# Patient Record
Sex: Female | Born: 1950 | Race: Black or African American | Hispanic: No | Marital: Single | Smoking: Never smoker
Health system: Southern US, Community
[De-identification: ages and names within clinical notes are randomized; demographics above are authoritative.]

## PROBLEM LIST (undated history)

## (undated) DIAGNOSIS — D649 Anemia, unspecified: Secondary | ICD-10-CM

## (undated) DIAGNOSIS — M1990 Unspecified osteoarthritis, unspecified site: Secondary | ICD-10-CM

## (undated) DIAGNOSIS — K219 Gastro-esophageal reflux disease without esophagitis: Secondary | ICD-10-CM

## (undated) DIAGNOSIS — I10 Essential (primary) hypertension: Secondary | ICD-10-CM

## (undated) DIAGNOSIS — R238 Other skin changes: Secondary | ICD-10-CM

## (undated) DIAGNOSIS — I499 Cardiac arrhythmia, unspecified: Secondary | ICD-10-CM

## (undated) DIAGNOSIS — E785 Hyperlipidemia, unspecified: Secondary | ICD-10-CM

## (undated) DIAGNOSIS — R233 Spontaneous ecchymoses: Secondary | ICD-10-CM

## (undated) DIAGNOSIS — F419 Anxiety disorder, unspecified: Secondary | ICD-10-CM

## (undated) HISTORY — PX: COLONOSCOPY: SHX174

## (undated) HISTORY — PX: EYE SURGERY: SHX253

## (undated) HISTORY — PX: WISDOM TOOTH EXTRACTION: SHX21

---

## 2005-08-21 ENCOUNTER — Ambulatory Visit: Payer: Self-pay | Admitting: General Practice

## 2006-03-20 ENCOUNTER — Other Ambulatory Visit: Payer: Self-pay

## 2006-03-20 ENCOUNTER — Emergency Department: Payer: Self-pay | Admitting: Emergency Medicine

## 2006-03-21 ENCOUNTER — Emergency Department: Payer: Self-pay | Admitting: Emergency Medicine

## 2006-07-31 ENCOUNTER — Other Ambulatory Visit: Payer: Self-pay

## 2006-08-14 ENCOUNTER — Ambulatory Visit: Payer: Self-pay | Admitting: Unknown Physician Specialty

## 2006-08-27 ENCOUNTER — Emergency Department: Payer: Self-pay | Admitting: Emergency Medicine

## 2006-09-16 ENCOUNTER — Ambulatory Visit: Payer: Self-pay | Admitting: General Practice

## 2006-11-23 ENCOUNTER — Emergency Department: Payer: Self-pay | Admitting: Internal Medicine

## 2007-04-27 ENCOUNTER — Ambulatory Visit: Payer: Self-pay | Admitting: Gastroenterology

## 2007-09-29 ENCOUNTER — Ambulatory Visit: Payer: Self-pay | Admitting: Internal Medicine

## 2008-09-02 ENCOUNTER — Ambulatory Visit: Payer: Self-pay | Admitting: Internal Medicine

## 2008-09-29 ENCOUNTER — Ambulatory Visit: Payer: Self-pay | Admitting: Internal Medicine

## 2008-10-01 ENCOUNTER — Emergency Department: Payer: Self-pay | Admitting: Internal Medicine

## 2009-10-23 ENCOUNTER — Emergency Department: Payer: Self-pay | Admitting: Internal Medicine

## 2009-11-15 ENCOUNTER — Ambulatory Visit: Payer: Self-pay | Admitting: Internal Medicine

## 2011-02-15 ENCOUNTER — Ambulatory Visit: Payer: Self-pay | Admitting: Internal Medicine

## 2011-02-15 ENCOUNTER — Emergency Department: Payer: Self-pay | Admitting: Emergency Medicine

## 2012-04-07 ENCOUNTER — Ambulatory Visit: Payer: Self-pay | Admitting: Internal Medicine

## 2013-05-28 ENCOUNTER — Ambulatory Visit: Payer: Self-pay | Admitting: Internal Medicine

## 2014-02-03 ENCOUNTER — Emergency Department: Payer: Self-pay | Admitting: Emergency Medicine

## 2014-02-03 LAB — URINALYSIS, COMPLETE
BACTERIA: NONE SEEN
Bilirubin,UR: NEGATIVE
Glucose,UR: NEGATIVE mg/dL (ref 0–75)
Ketone: NEGATIVE
Leukocyte Esterase: NEGATIVE
NITRITE: NEGATIVE
PH: 6 (ref 4.5–8.0)
Protein: NEGATIVE
RBC,UR: 1 /HPF (ref 0–5)
Specific Gravity: 1.012 (ref 1.003–1.030)
WBC UR: NONE SEEN /HPF (ref 0–5)

## 2014-07-07 ENCOUNTER — Ambulatory Visit: Payer: Self-pay | Admitting: Internal Medicine

## 2014-11-02 ENCOUNTER — Emergency Department: Payer: Self-pay | Admitting: Emergency Medicine

## 2014-11-02 LAB — CBC
HCT: 39.7 % (ref 35.0–47.0)
HGB: 12.9 g/dL (ref 12.0–16.0)
MCH: 30.5 pg (ref 26.0–34.0)
MCHC: 32.5 g/dL (ref 32.0–36.0)
MCV: 94 fL (ref 80–100)
Platelet: 252 10*3/uL (ref 150–440)
RBC: 4.23 10*6/uL (ref 3.80–5.20)
RDW: 13.1 % (ref 11.5–14.5)
WBC: 9 10*3/uL (ref 3.6–11.0)

## 2014-11-02 LAB — BASIC METABOLIC PANEL
ANION GAP: 8 (ref 7–16)
BUN: 20 mg/dL — ABNORMAL HIGH (ref 7–18)
CALCIUM: 9.5 mg/dL (ref 8.5–10.1)
CHLORIDE: 107 mmol/L (ref 98–107)
CO2: 28 mmol/L (ref 21–32)
Creatinine: 0.68 mg/dL (ref 0.60–1.30)
EGFR (African American): >60
EGFR (Non-African Amer.): >60
Glucose: 88 mg/dL (ref 65–99)
OSMOLALITY: 287 (ref 275–301)
Potassium: 3.5 mmol/L (ref 3.5–5.1)
Sodium: 143 mmol/L (ref 136–145)

## 2015-03-01 ENCOUNTER — Other Ambulatory Visit: Payer: Self-pay | Admitting: Physician Assistant

## 2015-03-01 DIAGNOSIS — S83207A Unspecified tear of unspecified meniscus, current injury, left knee, initial encounter: Secondary | ICD-10-CM

## 2015-03-09 ENCOUNTER — Ambulatory Visit
Admission: RE | Admit: 2015-03-09 | Discharge: 2015-03-09 | Disposition: A | Discharge status: Home / Self Care | Payer: BLUE CROSS/BLUE SHIELD | Source: Ambulatory Visit | Attending: Physician Assistant | Admitting: Physician Assistant

## 2015-03-09 DIAGNOSIS — M2242 Chondromalacia patellae, left knee: Secondary | ICD-10-CM | POA: Diagnosis not present

## 2015-03-09 DIAGNOSIS — S83262A Peripheral tear of lateral meniscus, current injury, left knee, initial encounter: Secondary | ICD-10-CM | POA: Insufficient documentation

## 2015-03-09 DIAGNOSIS — S83207A Unspecified tear of unspecified meniscus, current injury, left knee, initial encounter: Secondary | ICD-10-CM

## 2015-03-09 DIAGNOSIS — M25462 Effusion, left knee: Secondary | ICD-10-CM | POA: Insufficient documentation

## 2015-03-09 DIAGNOSIS — M94262 Chondromalacia, left knee: Secondary | ICD-10-CM | POA: Diagnosis not present

## 2015-03-09 DIAGNOSIS — R937 Abnormal findings on diagnostic imaging of other parts of musculoskeletal system: Secondary | ICD-10-CM | POA: Diagnosis not present

## 2015-03-09 DIAGNOSIS — S83012A Lateral subluxation of left patella, initial encounter: Secondary | ICD-10-CM | POA: Diagnosis not present

## 2015-05-08 DIAGNOSIS — M1712 Unilateral primary osteoarthritis, left knee: Secondary | ICD-10-CM | POA: Insufficient documentation

## 2015-09-13 ENCOUNTER — Other Ambulatory Visit: Payer: BLUE CROSS/BLUE SHIELD

## 2015-09-13 ENCOUNTER — Encounter: Payer: Self-pay | Admitting: *Deleted

## 2015-09-13 DIAGNOSIS — I10 Essential (primary) hypertension: Secondary | ICD-10-CM | POA: Diagnosis not present

## 2015-09-13 NOTE — Patient Instructions (Addendum)
  Your procedure is scheduled on: 09-26-15 (TUESDAY) Report to [facility] SAME [facility] 2ND FLOOR To find out your arrival time please call [phone] between 1PM - 3PM on 09-25-15 [facility](MONDAY)  Remember: Instructions that are not followed completely may result in serious medical risk, up to and including death, or upon the discretion of your surgeon and anesthesiologist your surgery may need to be rescheduled.    _X___ 1. Do not eat food or drink liquids after midnight. No gum chewing or hard candies.     _X___ 2. No Alcohol for 24 hours before or after surgery.   ____ 3. Bring all medications with you on the day of surgery if instructed.    _X___ 4. Notify your doctor if there is any change in your medical condition     (cold, fever, infections).     Do not wear jewelry, make-up, hairpins, clips or nail polish.  Do not wear lotions, powders, or perfumes. You may wear deodorant.  Do not shave 48 hours prior to surgery. Men may shave face and neck.  Do not bring valuables to the hospital.    [facility] not responsible for any belongings or valuables.               Contacts, dentures or bridgework may not be worn into surgery.  Leave your suitcase in the car. After surgery it may be brought to your room.  For patients admitted to the hospital, discharge time is determined by your  treatment team.   Patients discharged the day of surgery will not be allowed to drive home.   Please read over the following fact sheets that you were given:      _X___ Take these medicines the morning of surgery with A SIP OF WATER:    1. ZETIA  2. NEXIUM   3. TAKE A NEXIUM ON Monday NIGHT  4. MAY TAKE XANAX IF NEEDED   5.  6.  ____ Fleet Enema (as directed)   _X___ Use CHG Soap as directed  ____ Use inhalers on the day of surgery  ____ Stop metformin 2 days prior to surgery    ____ Take 1/2 of usual insulin dose the night before surgery and none on the morning of surgery.   ____ Stop  Coumadin/Plavix/aspirin-N/A  _X___ Stop Anti-inflammatories-STOP NAPROXEN AND IBUPROFEN NOW-NO NSAIDS OR ASA PRODUCTS-TYLENOL OK   _X___ Stop supplements until after surgery-STOP KRILL OIL NOW   ____ Bring C-Pap to the hospital.

## 2015-09-18 ENCOUNTER — Encounter
Admission: RE | Admit: 2015-09-18 | Discharge: 2015-09-18 | Disposition: A | Discharge status: Home / Self Care | Payer: BLUE CROSS/BLUE SHIELD | Source: Ambulatory Visit | Attending: Anesthesiology | Admitting: Anesthesiology

## 2015-09-18 DIAGNOSIS — Z01812 Encounter for preprocedural laboratory examination: Secondary | ICD-10-CM | POA: Insufficient documentation

## 2015-09-18 DIAGNOSIS — Z01818 Encounter for other preprocedural examination: Secondary | ICD-10-CM | POA: Insufficient documentation

## 2015-09-18 LAB — POTASSIUM: POTASSIUM: 4.2 mmol/L (ref 3.5–5.1)

## 2015-09-26 ENCOUNTER — Encounter
Admission: RE | Disposition: A | Discharge status: Home / Self Care | Payer: Self-pay | Source: Ambulatory Visit | Attending: Surgery

## 2015-09-26 ENCOUNTER — Ambulatory Visit: Payer: BLUE CROSS/BLUE SHIELD | Admitting: Anesthesiology

## 2015-09-26 ENCOUNTER — Ambulatory Visit
Admission: RE | Admit: 2015-09-26 | Discharge: 2015-09-26 | Disposition: A | Discharge status: Home / Self Care | Payer: BLUE CROSS/BLUE SHIELD | Source: Ambulatory Visit | Attending: Surgery | Admitting: Surgery

## 2015-09-26 DIAGNOSIS — Z825 Family history of asthma and other chronic lower respiratory diseases: Secondary | ICD-10-CM | POA: Diagnosis not present

## 2015-09-26 DIAGNOSIS — E785 Hyperlipidemia, unspecified: Secondary | ICD-10-CM | POA: Insufficient documentation

## 2015-09-26 DIAGNOSIS — Z91040 Latex allergy status: Secondary | ICD-10-CM | POA: Insufficient documentation

## 2015-09-26 DIAGNOSIS — Z79899 Other long term (current) drug therapy: Secondary | ICD-10-CM | POA: Insufficient documentation

## 2015-09-26 DIAGNOSIS — M179 Osteoarthritis of knee, unspecified: Secondary | ICD-10-CM | POA: Diagnosis present

## 2015-09-26 DIAGNOSIS — Z823 Family history of stroke: Secondary | ICD-10-CM | POA: Insufficient documentation

## 2015-09-26 DIAGNOSIS — K219 Gastro-esophageal reflux disease without esophagitis: Secondary | ICD-10-CM | POA: Diagnosis not present

## 2015-09-26 DIAGNOSIS — Z8249 Family history of ischemic heart disease and other diseases of the circulatory system: Secondary | ICD-10-CM | POA: Insufficient documentation

## 2015-09-26 DIAGNOSIS — M23242 Derangement of anterior horn of lateral meniscus due to old tear or injury, left knee: Secondary | ICD-10-CM | POA: Insufficient documentation

## 2015-09-26 DIAGNOSIS — M2242 Chondromalacia patellae, left knee: Secondary | ICD-10-CM | POA: Diagnosis not present

## 2015-09-26 DIAGNOSIS — Z880 Allergy status to penicillin: Secondary | ICD-10-CM | POA: Insufficient documentation

## 2015-09-26 DIAGNOSIS — I10 Essential (primary) hypertension: Secondary | ICD-10-CM | POA: Insufficient documentation

## 2015-09-26 DIAGNOSIS — Z882 Allergy status to sulfonamides status: Secondary | ICD-10-CM | POA: Diagnosis not present

## 2015-09-26 DIAGNOSIS — R002 Palpitations: Secondary | ICD-10-CM | POA: Diagnosis not present

## 2015-09-26 HISTORY — DX: Hyperlipidemia, unspecified: E78.5

## 2015-09-26 HISTORY — DX: Gastro-esophageal reflux disease without esophagitis: K21.9

## 2015-09-26 HISTORY — DX: Cardiac arrhythmia, unspecified: I49.9

## 2015-09-26 HISTORY — DX: Essential (primary) hypertension: I10

## 2015-09-26 HISTORY — DX: Spontaneous ecchymoses: R23.3

## 2015-09-26 HISTORY — DX: Other skin changes: R23.8

## 2015-09-26 HISTORY — PX: KNEE ARTHROSCOPY WITH MENISCAL REPAIR: SHX5653

## 2015-09-26 HISTORY — DX: Unspecified osteoarthritis, unspecified site: M19.90

## 2015-09-26 HISTORY — DX: Anxiety disorder, unspecified: F41.9

## 2015-09-26 HISTORY — DX: Anemia, unspecified: D64.9

## 2015-09-26 SURGERY — ARTHROSCOPY, KNEE, WITH MENISCUS REPAIR
Anesthesia: General | Site: Knee | Laterality: Left | Wound class: Clean

## 2015-09-26 MED ORDER — METOCLOPRAMIDE HCL 5 MG/ML IJ SOLN: 5.0000 mg | Freq: Three times a day (TID) | INTRAMUSCULAR | Status: DC | PRN

## 2015-09-26 MED ORDER — POTASSIUM CHLORIDE IN NACL 20-0.9 MEQ/L-% IV SOLN: INTRAVENOUS | Status: DC

## 2015-09-26 MED ORDER — ONDANSETRON HCL 4 MG/2ML IJ SOLN
INTRAMUSCULAR | Status: DC | PRN
  Administered 2015-09-26: 4 mg via INTRAVENOUS

## 2015-09-26 MED ORDER — CLINDAMYCIN PHOSPHATE 900 MG/50ML IV SOLN: 900.0000 mg | Freq: Once | INTRAVENOUS | Status: DC

## 2015-09-26 MED ORDER — ONDANSETRON HCL 4 MG/2ML IJ SOLN: 4.0000 mg | Freq: Four times a day (QID) | INTRAMUSCULAR | Status: DC | PRN

## 2015-09-26 MED ORDER — ONDANSETRON HCL 4 MG PO TABS: 4.0000 mg | ORAL_TABLET | Freq: Four times a day (QID) | ORAL | Status: DC | PRN

## 2015-09-26 MED ORDER — LIDOCAINE HCL (PF) 1 % IJ SOLN
INTRAMUSCULAR | Status: AC
  Filled 2015-09-26: qty 30

## 2015-09-26 MED ORDER — METOCLOPRAMIDE HCL 10 MG PO TABS: 5.0000 mg | ORAL_TABLET | Freq: Three times a day (TID) | ORAL | Status: DC | PRN

## 2015-09-26 MED ORDER — FENTANYL CITRATE (PF) 100 MCG/2ML IJ SOLN
INTRAMUSCULAR | Status: DC | PRN
  Administered 2015-09-26 (×2): 50 ug via INTRAVENOUS

## 2015-09-26 MED ORDER — BUPIVACAINE-EPINEPHRINE (PF) 0.5% -1:200000 IJ SOLN
INTRAMUSCULAR | Status: AC
  Filled 2015-09-26: qty 30

## 2015-09-26 MED ORDER — LIDOCAINE HCL (CARDIAC) 20 MG/ML IV SOLN
INTRAVENOUS | Status: DC | PRN
  Administered 2015-09-26: 100 mg via INTRAVENOUS

## 2015-09-26 MED ORDER — HYDROCODONE-ACETAMINOPHEN 5-325 MG PO TABS: 1.0000 | ORAL_TABLET | Freq: Four times a day (QID) | ORAL | Status: DC | PRN

## 2015-09-26 MED ORDER — EPHEDRINE SULFATE 50 MG/ML IJ SOLN
INTRAMUSCULAR | Status: DC | PRN
  Administered 2015-09-26: 15 mg via INTRAVENOUS

## 2015-09-26 MED ORDER — LIDOCAINE HCL 1 % IJ SOLN
INTRAMUSCULAR | Status: DC | PRN
  Administered 2015-09-26: 30 mL

## 2015-09-26 MED ORDER — BUPIVACAINE-EPINEPHRINE (PF) 0.5% -1:200000 IJ SOLN
INTRAMUSCULAR | Status: DC | PRN
  Administered 2015-09-26: 10 mL via PERINEURAL
  Administered 2015-09-26: 30 mL via PERINEURAL

## 2015-09-26 MED ORDER — FENTANYL CITRATE (PF) 100 MCG/2ML IJ SOLN: 25.0000 ug | INTRAMUSCULAR | Status: DC | PRN

## 2015-09-26 MED ORDER — LACTATED RINGERS IV SOLN
INTRAVENOUS | Status: DC
  Administered 2015-09-26: 10:00:00 via INTRAVENOUS

## 2015-09-26 MED ORDER — MIDAZOLAM HCL 2 MG/2ML IJ SOLN
INTRAMUSCULAR | Status: DC | PRN
  Administered 2015-09-26: 1 mg via INTRAVENOUS

## 2015-09-26 MED ORDER — PROMETHAZINE HCL 25 MG/ML IJ SOLN: 6.2500 mg | INTRAMUSCULAR | Status: DC | PRN

## 2015-09-26 MED ORDER — CLINDAMYCIN PHOSPHATE 900 MG/50ML IV SOLN
INTRAVENOUS | Status: AC
Start: 2015-09-26 — End: 2015-09-26
  Administered 2015-09-26: 900 mg via INTRAVENOUS
  Filled 2015-09-26: qty 50

## 2015-09-26 MED ORDER — PROPOFOL 10 MG/ML IV BOLUS
INTRAVENOUS | Status: DC | PRN
  Administered 2015-09-26: 120 mg via INTRAVENOUS

## 2015-09-26 MED ORDER — HYDROCODONE-ACETAMINOPHEN 5-325 MG PO TABS: 1.0000 | ORAL_TABLET | ORAL | Status: DC | PRN

## 2015-09-26 SURGICAL SUPPLY — 29 items
BAG COUNTER SPONGE EZ (MISCELLANEOUS) IMPLANT
BANDAGE ACE 6X5 VEL STRL LF (GAUZE/BANDAGES/DRESSINGS) ×3 IMPLANT
BLADE FULL RADIUS 3.5 (BLADE) ×3 IMPLANT
BLADE SHAVER 4.5X7 STR FR (MISCELLANEOUS) IMPLANT
CHLORAPREP W/TINT 26ML (MISCELLANEOUS) ×3 IMPLANT
COUNTER SPONGE BAG EZ (MISCELLANEOUS)
GAUZE SPONGE 4X4 12PLY STRL (GAUZE/BANDAGES/DRESSINGS) ×3 IMPLANT
GLOVE BIO SURGEON STRL SZ8 (GLOVE) ×6 IMPLANT
GLOVE BIOGEL M 7.0 STRL (GLOVE) ×6 IMPLANT
GLOVE BIOGEL PI IND STRL 7.5 (GLOVE) ×1 IMPLANT
GLOVE BIOGEL PI INDICATOR 7.5 (GLOVE) ×2
GLOVE INDICATOR 8.0 STRL GRN (GLOVE) ×3 IMPLANT
GOWN STRL REUS W/ TWL LRG LVL3 (GOWN DISPOSABLE) ×1 IMPLANT
GOWN STRL REUS W/ TWL XL LVL3 (GOWN DISPOSABLE) ×2 IMPLANT
GOWN STRL REUS W/TWL LRG LVL3 (GOWN DISPOSABLE) ×2
GOWN STRL REUS W/TWL XL LVL3 (GOWN DISPOSABLE) ×4
IV LACTATED RINGER IRRG 3000ML (IV SOLUTION) ×2
IV LR IRRIG 3000ML ARTHROMATIC (IV SOLUTION) ×1 IMPLANT
KIT RM TURNOVER STRD PROC AR (KITS) ×3 IMPLANT
MANIFOLD NEPTUNE II (INSTRUMENTS) ×3 IMPLANT
NEEDLE HYPO 21X1.5 SAFETY (NEEDLE) ×3 IMPLANT
PACK ARTHROSCOPY KNEE (MISCELLANEOUS) ×3 IMPLANT
PAD GROUND ADULT SPLIT (MISCELLANEOUS) ×3 IMPLANT
PENCIL ELECTRO HAND CTR (MISCELLANEOUS) ×3 IMPLANT
SUT PROLENE 4 0 PS 2 18 (SUTURE) ×3 IMPLANT
SUT TI-CRON 2-0 W/10 SWGD (SUTURE) IMPLANT
SYR 50ML LL SCALE MARK (SYRINGE) ×3 IMPLANT
TUBING ARTHRO INFLOW-ONLY STRL (TUBING) ×3 IMPLANT
WAND HAND CNTRL MULTIVAC 90 (MISCELLANEOUS) IMPLANT

## 2015-09-26 NOTE — Anesthesia Procedure Notes (Signed)
Procedure Name: LMA Insertion Date/Time: 09/26/2015 9:59 AM Performed by: Shirlee LimerickMARION, Brittyn Salaz Pre-anesthesia Checklist: Patient identified, Emergency Drugs available, Suction available and Patient being monitored Patient Re-evaluated:Patient Re-evaluated prior to inductionOxygen Delivery Method: Circle system utilized Preoxygenation: Pre-oxygenation with 100% oxygen Intubation Type: IV induction LMA: LMA inserted LMA Size: 4.0 Number of attempts: 1 Placement Confirmation: breath sounds checked- equal and bilateral Tube secured with: Tape Dental Injury: Teeth and Oropharynx as per pre-operative assessment

## 2015-09-26 NOTE — Op Note (Signed)
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  Postoperative diagnosis:   Same.  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  Surgeon:   Maryagnes AmosJ. Jeffrey Marketia Stallsmith, M.D.  Assistant:   Julieta BelliniEnoch Asiedu, PA-S.  Anesthesia:   General LMA.  Findings:   As above. There also was extensive grade 4 chondromalacia involving the lateral tibial plateau, and grade 1-2 chondromalacia involving the lateral femoral condyle, the medial tibial plateau, and the medial femoral condyle. The medial meniscus was in satisfactory condition, as were the anterior and posterior cruciate ligaments.  Complications:   None.  EBL:   2 cc.  Total fluids:   500 cc of crystalloid.  Tourniquet time:   None  Drains:   None  Closure:   4-0 Prolene interrupted sutures.  Brief clinical note:   The patient is a 64 year old female with a history of gradually worsening lateral sided left knee pain. Her history and examination were suspicious for degenerative joint disease with a degenerative lateral meniscus tear, all of which were confirmed by MRI scan. Her symptoms have progressed despite medications, activity modification, etc. The patient presents at this time for arthroscopy, debridement, and partial lateral meniscectomy.  Procedure:   The patient was brought into the operating room and lain in the supine position. After adequate general laryngeal mask anesthesia was obtained, a timeout was performed to verify the appropriate side. The patient's left knee was injected sterilely using a solution of 30 cc of 1% lidocaine and 30 cc of 0.5% Sensorcaine with epinephrine. The left lower extremity was prepped with ChloraPrep solution before being draped sterilely. Preoperative antibiotics were administered. The expected portal sites were  injected with 0.5% Sensorcaine with epinephrine before the camera was placed in the anterolateral portal and instrumentation performed through the anteromedial portal. The knee was sequentially examined beginning in the suprapatellar pouch, then progressing to the patellofemoral space, the medial gutter compartment, the notch, and finally the lateral compartment and gutter. The findings were as described above. Abundant reactive synovial tissues anteriorly were debrided using the full-radius resector in order to improve visualization. The medial meniscus was intact to probing. Laterally, there was extensive degenerative tearing of the anterior and middle portions of the meniscus. These areas were debrided back to stable margins using combination of the straight and side-biting baskets and, and full-radius resector. Subsequent probing of the remaining rim demonstrated good stability. The area of grade 3-4 chondromalacia involving the central and lateral portions of the patella were debrided back to stable margins using the full-radius resector. This was accomplished by repositioning the arthroscope medially and putting the shaver into the anterolateral portal. The instruments were removed from the joint after suctioning the excess fluid. The portal sites were closed using 4-0 Prolene interrupted sutures before a sterile bulky dressing was applied to the knee. The patient was then awakened, extubated, and returned to the recovery room in satisfactory condition after tolerating the procedure well.

## 2015-09-26 NOTE — H&P (Signed)
Paper H&P to be scanned into permanent record. H&P reviewed. No changes. 

## 2015-09-26 NOTE — Anesthesia Preprocedure Evaluation (Addendum)
Anesthesia Evaluation  Patient identified by MRN, date of birth, ID band Patient awake    Reviewed: Allergy & Precautions, H&P , NPO status , Patient's Chart, lab work & pertinent test results, reviewed documented beta blocker date and time   History of Anesthesia Complications (+) PONV and history of anesthetic complications  Airway Mallampati: IV  TM Distance: >3 FB Neck ROM: full    Dental no notable dental hx. (+) Edentulous Upper, Edentulous Lower   Pulmonary neg pulmonary ROS,    Pulmonary exam normal breath sounds clear to auscultation       Cardiovascular Exercise Tolerance: Good hypertension, (-) angina(-) CAD, (-) Past MI, (-) Cardiac Stents and (-) CABG Normal cardiovascular exam+ dysrhythmias (palpitations) (-) Valvular Problems/Murmurs Rhythm:regular Rate:Normal     Neuro/Psych negative neurological ROS  negative psych ROS   GI/Hepatic Neg liver ROS, GERD  Medicated and Controlled,  Endo/Other  negative endocrine ROS  Renal/GU negative Renal ROS  negative genitourinary   Musculoskeletal   Abdominal   Peds  Hematology negative hematology ROS (+)   Anesthesia Other Findings Past Medical History:   Hypertension                                                 Hyperlipidemia                                               Dysrhythmia                                                    Comment:PALPITATIONS   Anxiety                                                      GERD (gastroesophageal reflux disease)                       Arthritis                                                    Anemia                                                         Comment:H/O   Bruises easily                                               Reproductive/Obstetrics negative OB ROS  Anesthesia Physical Anesthesia Plan  ASA: II  Anesthesia Plan: General   Post-op Pain  Management:    Induction:   Airway Management Planned:   Additional Equipment:   Intra-op Plan:   Post-operative Plan:   Informed Consent: I have reviewed the patients History and Physical, chart, labs and discussed the procedure including the risks, benefits and alternatives for the proposed anesthesia with the patient or authorized representative who has indicated his/her understanding and acceptance.   Dental Advisory Given  Plan Discussed with: Anesthesiologist, CRNA and Surgeon  Anesthesia Plan Comments:         Anesthesia Quick Evaluation

## 2015-09-26 NOTE — Transfer of Care (Signed)
Immediate Anesthesia Transfer of Care Note  Patient: Jodi Goodman  Procedure(s) Performed: Procedure(s): KNEE ARTHROSCOPY WITH partial lateral menisectomy and abrasion chondoplasty of patella. (Left)  Patient Location: PACU  Anesthesia Type:General  Level of Consciousness: awake, alert , oriented and patient cooperative  Airway & Oxygen Therapy: Patient Spontanous Breathing  Post-op Assessment: Report given to RN and Post -op Vital signs reviewed and stable  Post vital signs: Reviewed and stable  Last Vitals:  Filed Vitals:   09/26/15 0920 09/26/15 1059  BP: 146/100 121/73  Pulse: 61 78  Temp: 36.8 C 36.9 C  Resp: 16     Complications: No apparent anesthesia complications

## 2015-09-26 NOTE — Discharge Instructions (Addendum)
Keep dressing dry and intact.  May shower after dressing changed on post-op day #4 (Saturday).  Cover sutures with Band-Aids after drying off. Apply ice frequently to knee. May weight-bear as tolerated - use crutches or walker as needed.Follow-up in 10-14 days or as scheduled.      .AMBULATORY SURGERY  DISCHARGE INSTRUCTIONS   1) The drugs that you were given will stay in your system until tomorrow so for the next 24 hours you should not:  A) Drive an automobile B) Make any legal decisions C) Drink any alcoholic beverage   2) You may resume regular meals tomorrow.  Today it is better to start with liquids and gradually work up to solid foods.  You may eat anything you prefer, but it is better to start with liquids, then soup and crackers, and gradually work up to solid foods.   3) Please notify your doctor immediately if you have any unusual bleeding, trouble breathing, redness and pain at the surgery site, drainage, fever, or pain not relieved by medication. 4)   5) Your post-operative visit with Dr.                                     is: Date:                        Time:    Please call to schedule your post-operative visit.  6) Additional Instructions:

## 2015-09-27 NOTE — Anesthesia Postprocedure Evaluation (Signed)
Anesthesia Post Note  Patient: Jodi KollerJanette Goodman  Procedure(s) Performed: Procedure(s) (LRB): KNEE ARTHROSCOPY WITH partial lateral menisectomy and abrasion chondoplasty of patella. (Left)  Patient location during evaluation: PACU Anesthesia Type: General Level of consciousness: awake and alert Pain management: pain level controlled Vital Signs Assessment: post-procedure vital signs reviewed and stable Respiratory status: spontaneous breathing, nonlabored ventilation, respiratory function stable and patient connected to nasal cannula oxygen Cardiovascular status: blood pressure returned to baseline and stable Postop Assessment: no signs of nausea or vomiting Anesthetic complications: no    Last Vitals:  Filed Vitals:   09/26/15 1145 09/26/15 1159  BP: 118/84 131/94  Pulse: 64 60  Temp:  36.8 C  Resp: 14 16    Last Pain: There were no vitals filed for this visit.               Lenard SimmerAndrew Ruchel Brandenburger

## 2016-09-24 ENCOUNTER — Other Ambulatory Visit: Payer: Self-pay | Admitting: Internal Medicine

## 2016-09-24 DIAGNOSIS — Z1231 Encounter for screening mammogram for malignant neoplasm of breast: Secondary | ICD-10-CM

## 2016-10-25 ENCOUNTER — Ambulatory Visit: Payer: BLUE CROSS/BLUE SHIELD

## 2016-11-25 ENCOUNTER — Ambulatory Visit
Admission: RE | Admit: 2016-11-25 | Discharge: 2016-11-25 | Disposition: A | Discharge status: Home / Self Care | Payer: Medicare HMO | Source: Ambulatory Visit | Attending: Internal Medicine | Admitting: Internal Medicine

## 2016-11-25 DIAGNOSIS — Z1231 Encounter for screening mammogram for malignant neoplasm of breast: Secondary | ICD-10-CM | POA: Diagnosis present

## 2017-12-01 ENCOUNTER — Other Ambulatory Visit: Payer: Self-pay | Admitting: Internal Medicine

## 2017-12-01 DIAGNOSIS — Z1231 Encounter for screening mammogram for malignant neoplasm of breast: Secondary | ICD-10-CM

## 2017-12-19 ENCOUNTER — Ambulatory Visit
Admission: RE | Admit: 2017-12-19 | Discharge: 2017-12-19 | Disposition: A | Discharge status: Home / Self Care | Payer: Medicare HMO | Source: Ambulatory Visit | Attending: Internal Medicine | Admitting: Internal Medicine

## 2017-12-19 DIAGNOSIS — Z1231 Encounter for screening mammogram for malignant neoplasm of breast: Secondary | ICD-10-CM | POA: Diagnosis not present

## 2018-11-23 ENCOUNTER — Other Ambulatory Visit: Payer: Self-pay | Admitting: Internal Medicine

## 2018-11-23 DIAGNOSIS — Z1231 Encounter for screening mammogram for malignant neoplasm of breast: Secondary | ICD-10-CM

## 2018-12-25 ENCOUNTER — Ambulatory Visit
Admission: RE | Admit: 2018-12-25 | Discharge: 2018-12-25 | Disposition: A | Discharge status: Home / Self Care | Payer: Medicare HMO | Source: Ambulatory Visit | Attending: Internal Medicine | Admitting: Internal Medicine

## 2018-12-25 DIAGNOSIS — Z1231 Encounter for screening mammogram for malignant neoplasm of breast: Secondary | ICD-10-CM | POA: Diagnosis present

## 2019-01-12 ENCOUNTER — Ambulatory Visit: Payer: Medicare HMO | Admitting: Student in an Organized Health Care Education/Training Program

## 2019-02-04 NOTE — Progress Notes (Signed)
Patient's Name: Jodi Goodman  MRN: [id number]  Referring Provider: Christena Flake, MD  DOB: 10-25-1950  PCP: Sherron Monday, MD  DOS: 02/11/2019  Note by: Edward Jolly, MD  Service setting: Ambulatory outpatient  Specialty: Interventional Pain Management  Location: [facility] Virtual Visit  Visit type: Initial Patient Evaluation  Patient type: New Patient   Pain Management Virtual Encounter Note - Virtual Visit via Video Conference Telehealth (real-time audio visits between healthcare provider and patient).  Patient's Phone No.:  [phone] (home); [phone] (mobile); (Preferred) [phone] [email or web address] [address]), [place] - [address] [place]) [place] [address] Phone: [phone] Fax: [phone]   Pre-screening note:  Our staff contacted Jodi Goodman and offered her an "in person", "face-to-face" appointment versus a telephone encounter. She indicated preferring the telephone encounter, at this time.  Primary Reason(s) for Visit: Tele-Encounter for initial evaluation of one or more chronic problems (new to examiner) potentially causing chronic pain, and posing a threat to normal musculoskeletal function. (Level of risk: High) CC: Knee Pain (left)  I contacted Jodi Goodman on 02/11/2019 at 2:57 PM via video conference and clearly identified myself as Edward Jolly, MD. I verified that I was speaking with the correct person using two identifiers (Name and date of birth: 11/27/1950).  Advanced Informed Consent I sought verbal advanced consent from Jodi Goodman for virtual visit interactions. I informed Jodi Goodman of possible security and privacy concerns, risks, and limitations associated with providing "not-in-person" medical evaluation and management services. I also informed Jodi Goodman of the availability of "in-person" appointments. Finally, I informed her that there would be a  charge for the virtual visit and that she could be  personally, fully or partially, financially responsible for it. Jodi Goodman expressed understanding and agreed to proceed.   HPI  Jodi Goodman is a 68 y.o. year old, female patient, contacted today for an initial evaluation of her chronic pain. She has Primary osteoarthritis of left knee; Chronic pain of left knee; and Chronic pain syndrome on their problem list.  Pain Assessment: Location: Left Knee Radiating: left lower leg Onset: More than a month ago Duration: Chronic pain Quality: Aching, Throbbing Severity: 8 /10 (subjective, self-reported pain score)  Effect on ADL: difficulty performing daily activities Timing: Constant Modifying factors: cold, heat, brace BP:    HR:    Onset and Duration: Sudden and Present longer than 3 months Cause of pain: Surgery Severity: No change since onset, NAS-11 at its worse: 9/10, NAS-11 at its best: 5/10, NAS-11 now: 7/10 and NAS-11 on the average: 6/10 Timing: Night and After activity or exercise Aggravating Factors: Motion, Walking, Walking uphill and Walking downhill Alleviating Factors: Cold packs, Hot packs, Medications and Using a brace Associated Problems: Pain that wakes patient up and Pain that does not allow patient to sleep Quality of Pain: Aching and Throbbing Previous Examinations or Tests: MRI scan, X-rays and Orthopedic evaluation Previous Treatments: Narcotic medications and Physical Therapy  68 year old female who endorses pain of her left knee.  This is been persistent and been going on for many years.  She has seen orthopedics, Dr. Joice Lofts.  Last visit was 11/30/2018.  She is status post intra-articular left knee steroid injections as well as intra-articular Hyalgan, neither of which has been very effective long-term for her persistent left knee pain.  She also has knee effusions and has fluid drained occasionally by Dr. Joice Lofts.  She endorses pain with weightbearing and difficulty  walking.  She also believes that her right knee is out of alignment given pain that she experiences and she has to modify her gait as a result.  She is being referred from Dr. Joice LoftsPoggi as a fast-track for left knee genicular nerve block and possible cooled radiofrequency ablation.  We will focus on interventional management only.  Meds   Current Outpatient Medications:  .  ALPRAZolam (XANAX) 0.25 MG tablet, Take 0.25 mg by mouth 2 (two) times daily as needed for anxiety., Disp: , Rfl:  .  chlorthalidone (HYGROTON) 25 MG tablet, Take 25 mg by mouth as needed. 1/2 tab daily, Disp: , Rfl:  .  Cholecalciferol (VITAMIN D3 PO), Take 1 tablet by mouth. 1000 mg daily, Disp: , Rfl:  .  ezetimibe (ZETIA) 10 MG tablet, Take 10 mg by mouth daily with lunch., Disp: , Rfl:  .  KRILL OIL PO, Take 1 tablet by mouth daily., Disp: , Rfl:  .  ramipril (ALTACE) 10 MG capsule, Take 10 mg by mouth at bedtime., Disp: , Rfl:  .  alendronate (FOSAMAX) 70 MG tablet, Take 70 mg by mouth once a week. Take with a full glass of water on an empty stomach., Disp: , Rfl:  .  diclofenac sodium (VOLTAREN) 1 % GEL, Apply topically as needed., Disp: , Rfl:  .  esomeprazole (NEXIUM) 40 MG capsule, Take 40 mg by mouth as needed., Disp: , Rfl:  .  HYDROcodone-acetaminophen (NORCO) 5-325 MG tablet, Take 1-2 tablets by mouth every 6 (six) hours as needed for moderate pain. MAXIMUM TOTAL ACETAMINOPHEN DOSE IS 4000 MG PER DAY (Patient not taking: Reported on 02/10/2019), Disp: 40 tablet, Rfl: 0 .  naproxen (NAPROSYN) 500 MG tablet, Take 250 mg by mouth daily., Disp: , Rfl:   ROS  Cardiovascular: No reported cardiovascular signs or symptoms such as High blood pressure, coronary artery disease, abnormal heart rate or rhythm, heart attack, blood thinner therapy or heart weakness and/or failure Pulmonary or Respiratory: No reported pulmonary signs or symptoms such as wheezing and difficulty taking a deep full breath (Asthma), difficulty blowing  air out (Emphysema), coughing up mucus (Bronchitis), persistent dry cough, or temporary stoppage of breathing during sleep Neurological: No reported neurological signs or symptoms such as seizures, abnormal skin sensations, urinary and/or fecal incontinence, being born with an abnormal open spine and/or a tethered spinal cord Review of Past Neurological Studies: No results found for this or any previous visit. Psychological-Psychiatric: No reported psychological or psychiatric signs or symptoms such as difficulty sleeping, anxiety, depression, delusions or hallucinations (schizophrenial), mood swings (bipolar disorders) or suicidal ideations or attempts Gastrointestinal: Vomiting blood (Ulcers) and Reflux or heatburn Genitourinary: No reported renal or genitourinary signs or symptoms such as difficulty voiding or producing urine, peeing blood, non-functioning kidney, kidney stones, difficulty emptying the bladder, difficulty controlling the flow of urine, or chronic kidney disease Hematological: No reported hematological signs or symptoms such as prolonged bleeding, low or poor functioning platelets, bruising or bleeding easily, hereditary bleeding problems, low energy levels due to low hemoglobin or being anemic Endocrine: No reported endocrine signs or symptoms such as high or low blood sugar, rapid heart rate due to high thyroid levels, obesity or weight gain due to slow thyroid or thyroid disease Rheumatologic: No reported rheumatological signs and symptoms such as fatigue, joint pain, tenderness, swelling, redness, heat, stiffness, decreased range of motion, with or without associated rash Musculoskeletal: Negative for myasthenia gravis, muscular dystrophy, multiple sclerosis or malignant hyperthermia Work History: Working part time  and Retired  Allergies  Jodi Goodman is allergic to aspirin; latex; penicillins; and sulfa antibiotics.  Laboratory Chemistry    Renal Function Markers Lab  Results  Component Value Date   BUN 20 (H) 11/02/2014   CREATININE 0.68 11/02/2014   GFRAA >60 11/02/2014   GFRNONAA >60 11/02/2014                             Hepatic Function Markers No results found for: AST, ALT, ALBUMIN, ALKPHOS, HCVAB, AMYLASE, LIPASE, AMMONIA                      Electrolytes Lab Results  Component Value Date   NA 143 11/02/2014   K 4.2 09/18/2015   CL 107 11/02/2014   CALCIUM 9.5 11/02/2014                        Coagulation Parameters Lab Results  Component Value Date   PLT 252 11/02/2014                        Cardiovascular Markers Lab Results  Component Value Date   HGB 12.9 11/02/2014   HCT 39.7 11/02/2014                         ID Markers No results found for: LYMEIGGIGMAB, HIV                      CA Markers No results found for: CEA, CA125, LABCA2                      Endocrine Markers No results found for: TSH, FREET4, TESTOFREE, TESTOSTERONE, SHBG, ESTRADIOL, ESTRADIOLPCT, ESTRADIOLFRE, LABPREG, ACTH                      Note: Lab results reviewed.  Imaging Review   Results for orders placed during the hospital encounter of 03/09/15  MR Knee Left  Wo Contrast   Narrative CLINICAL DATA:  Chronic progressive left knee pain with swelling and limited range of motion. Weakness.  EXAM: MRI OF THE LEFT KNEE WITHOUT CONTRAST  TECHNIQUE: Multiplanar, multisequence MR imaging of the knee was performed. No intravenous contrast was administered.  COMPARISON:  None.  FINDINGS: MENISCI  Medial meniscus:  Normal.  Lateral meniscus: Posterior horn and midbody are normal. There is extensive degeneration of the anterior horn with distortion of the anterior attachment. There is edema in the anterior aspect of the tibia at the anterior attachment of the lateral meniscus and at the insertion of the anterior cruciate ligament.  LIGAMENTS  Cruciates: Intact. Enthesopathy at the tibial insertion of the ACL.  Collaterals:   Normal.  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 Small fissures in the lateral facet of the patella. The patella is slightly laterally subluxed.  Medial:  Diffuse slight thinning of the articular cartilage.  Lateral: Small fissure in the posterior aspect of the tibial plateau.  Joint:  All large joint effusion.  Popliteal Fossa:  Small Baker's cyst.  Extensor Mechanism: The patient has patella Oda Kilts. Distal quadriceps tendon and patellar tendon are intact.  Bones:  Tiny marginal osteophytes in the lateral compartment.  IMPRESSION: 1. Markedly abnormal anterior horn of the lateral meniscus. There is severe degeneration  and what appears to be some fragmentation adjacent to the anterior insertion. There are degenerative changes at the tibial insertion of the anterior horn. 2. 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 Slight lateral subluxation of the patella. 3. Slight chondromalacia in the medial and lateral compartments. 4. Prominent joint effusion.   Electronically Signed   By: Francene Boyers M.D.   On: 03/09/2015 12:25     Complexity Note: Imaging results reviewed. Results shared with Jodi Goodman, using Layman's terms.                         PFSH  Drug: Jodi Goodman  reports no history of drug use. Alcohol:  reports no history of alcohol use. Tobacco:  reports that she has never smoked. She has never used smokeless tobacco. Medical:  has a past medical history of Anemia, Anxiety, Arthritis, Bruises easily, Dysrhythmia, GERD (gastroesophageal reflux disease), Hyperlipidemia, and Hypertension. Family: family history is not on file.  Past Surgical History:  Procedure Laterality Date  . CESAREAN SECTION    . COLONOSCOPY    . KNEE ARTHROSCOPY WITH MENISCAL REPAIR Left 09/26/2015   Procedure: KNEE ARTHROSCOPY WITH partial lateral menisectomy and abrasion chondoplasty of patella.;  Surgeon: Christena Flake, MD;   Location: [facility] ORS;  Service: Orthopedics;  Laterality: Left;  . WISDOM TOOTH EXTRACTION     Active Ambulatory Problems    Diagnosis Date Noted  . Primary osteoarthritis of left knee 05/08/2015  . Chronic pain of left knee 02/11/2019  . Chronic pain syndrome 02/11/2019   Resolved Ambulatory Problems    Diagnosis Date Noted  . No Resolved Ambulatory Problems   Past Medical History:  Diagnosis Date  . Anemia   . Anxiety   . Arthritis   . Bruises easily   . Dysrhythmia   . GERD (gastroesophageal reflux disease)   . Hyperlipidemia   . Hypertension    Assessment  Primary Diagnosis & Pertinent Problem List: The primary encounter diagnosis was Primary osteoarthritis of left knee. Diagnoses of Chronic pain of left knee and Chronic pain syndrome were also pertinent to this visit.  Visit Diagnosis (New problems to examiner): 1. Primary osteoarthritis of left knee   2. Chronic pain of left knee   3. Chronic pain syndrome    68 year old female with a history of left knee osteoarthritis referred by Dr. Joice Lofts status post left intra-articular knee steroid injections, left Synvisc with persistent and worsening left knee pain.  She is referred here for consideration of left knee cooled radiofrequency ablation.  I had an extensive discussion with the patient about the risks and benefits of this procedure including the diagnostic genicular nerve block followed possibly by radiofrequency ablation pending how she does with the block.  Patient would like to proceed.  We will notify the patient with scheduled date after restrictions for elective procedures are lifted in context of COVID-19 pandemic.  We will only be focusing on interventional therapies with this patient.  Medication management to continue with primary care provider.  Patient is not a candidate for chronic opioid therapy given her chronic Xanax use.  Procedure Orders     GENICULAR NERVE BLOCK (left), without  sedation   Interventional management options: Jodi Goodman was informed that there is no guarantee that she would be a candidate for interventional therapies. The decision will be based on the results of diagnostic studies, as well as Jodi Goodman's risk profile.  Procedure(s) under consideration:  Left knee genicular  nerve block followed by possible left genicular radiofrequency ablation.   Provider-requested follow-up: Return for Procedure, After COVID-19 restrictions lifted.  No future appointments.  Primary Care Physician: Sherron Monday, MD Location: [facility] Note by: Edward Jolly, MD Date: 02/11/2019; Time: 2:57 PM

## 2019-02-10 ENCOUNTER — Encounter: Payer: Self-pay | Admitting: Student in an Organized Health Care Education/Training Program

## 2019-02-11 ENCOUNTER — Ambulatory Visit
Payer: Medicare HMO | Attending: Student in an Organized Health Care Education/Training Program | Admitting: Student in an Organized Health Care Education/Training Program

## 2019-02-11 ENCOUNTER — Other Ambulatory Visit: Payer: Self-pay

## 2019-02-11 DIAGNOSIS — G894 Chronic pain syndrome: Secondary | ICD-10-CM | POA: Diagnosis not present

## 2019-02-11 DIAGNOSIS — M25562 Pain in left knee: Secondary | ICD-10-CM | POA: Diagnosis not present

## 2019-02-11 DIAGNOSIS — G8929 Other chronic pain: Secondary | ICD-10-CM | POA: Diagnosis not present

## 2019-02-11 DIAGNOSIS — M1712 Unilateral primary osteoarthritis, left knee: Secondary | ICD-10-CM | POA: Diagnosis not present

## 2019-03-22 ENCOUNTER — Telehealth: Payer: Self-pay | Admitting: *Deleted

## 2019-03-22 ENCOUNTER — Other Ambulatory Visit: Payer: Self-pay

## 2019-03-22 DIAGNOSIS — Z01818 Encounter for other preprocedural examination: Secondary | ICD-10-CM

## 2019-03-22 NOTE — Telephone Encounter (Signed)
Questions answered.

## 2019-03-26 ENCOUNTER — Other Ambulatory Visit
Admission: RE | Admit: 2019-03-26 | Discharge: 2019-03-26 | Disposition: A | Discharge status: Home / Self Care | Payer: Medicare HMO | Source: Ambulatory Visit | Attending: Student in an Organized Health Care Education/Training Program | Admitting: Student in an Organized Health Care Education/Training Program

## 2019-03-26 ENCOUNTER — Other Ambulatory Visit: Payer: Self-pay

## 2019-03-26 DIAGNOSIS — Z01812 Encounter for preprocedural laboratory examination: Secondary | ICD-10-CM | POA: Insufficient documentation

## 2019-03-26 DIAGNOSIS — Z1159 Encounter for screening for other viral diseases: Secondary | ICD-10-CM | POA: Insufficient documentation

## 2019-03-27 LAB — NOVEL CORONAVIRUS, NAA (HOSP ORDER, SEND-OUT TO REF LAB; TAT 18-24 HRS): SARS-CoV-2, NAA: NOT DETECTED

## 2019-03-29 ENCOUNTER — Telehealth: Payer: Self-pay | Admitting: *Deleted

## 2019-03-29 NOTE — Telephone Encounter (Signed)
All questions answered

## 2019-03-31 ENCOUNTER — Ambulatory Visit
Admission: RE | Admit: 2019-03-31 | Discharge: 2019-03-31 | Disposition: A | Discharge status: Home / Self Care | Payer: Medicare HMO | Source: Ambulatory Visit | Attending: Student in an Organized Health Care Education/Training Program | Admitting: Student in an Organized Health Care Education/Training Program

## 2019-03-31 ENCOUNTER — Other Ambulatory Visit: Payer: Self-pay

## 2019-03-31 ENCOUNTER — Ambulatory Visit (HOSPITAL_BASED_OUTPATIENT_CLINIC_OR_DEPARTMENT_OTHER): Payer: Medicare HMO | Admitting: Student in an Organized Health Care Education/Training Program

## 2019-03-31 ENCOUNTER — Encounter: Payer: Self-pay | Admitting: Student in an Organized Health Care Education/Training Program

## 2019-03-31 VITALS — BP 144/89 | HR 66 | Temp 98.2°F | Resp 14 | Ht 63.0 in | Wt 135.0 lb

## 2019-03-31 DIAGNOSIS — M25562 Pain in left knee: Secondary | ICD-10-CM | POA: Diagnosis present

## 2019-03-31 DIAGNOSIS — G894 Chronic pain syndrome: Secondary | ICD-10-CM | POA: Insufficient documentation

## 2019-03-31 DIAGNOSIS — M1712 Unilateral primary osteoarthritis, left knee: Secondary | ICD-10-CM | POA: Insufficient documentation

## 2019-03-31 DIAGNOSIS — G8929 Other chronic pain: Secondary | ICD-10-CM | POA: Insufficient documentation

## 2019-03-31 MED ORDER — ROPIVACAINE HCL 2 MG/ML IJ SOLN
2.0000 mL | Freq: Once | INTRAMUSCULAR | Status: AC
  Administered 2019-03-31: 10 mL via EPIDURAL

## 2019-03-31 MED ORDER — LIDOCAINE HCL 2 % IJ SOLN
20.0000 mL | Freq: Once | INTRAMUSCULAR | Status: AC
  Administered 2019-03-31: 400 mg
  Filled 2019-03-31: qty 20

## 2019-03-31 MED ORDER — ROPIVACAINE HCL 2 MG/ML IJ SOLN
INTRAMUSCULAR | Status: AC
  Filled 2019-03-31: qty 10

## 2019-03-31 MED ORDER — DEXAMETHASONE SODIUM PHOSPHATE 10 MG/ML IJ SOLN
10.0000 mg | Freq: Once | INTRAMUSCULAR | Status: AC
  Administered 2019-03-31: 10 mg

## 2019-03-31 MED ORDER — DEXAMETHASONE SODIUM PHOSPHATE 10 MG/ML IJ SOLN
INTRAMUSCULAR | Status: AC
  Filled 2019-03-31: qty 1

## 2019-03-31 NOTE — Patient Instructions (Signed)

## 2019-03-31 NOTE — Progress Notes (Signed)
Safety precautions to be maintained throughout the outpatient stay will include: orient to surroundings, keep bed in low position, maintain call bell within reach at all times, provide assistance with transfer out of bed and ambulation.  

## 2019-03-31 NOTE — Progress Notes (Signed)
Patient's Name: Jodi Goodman  MRN: [id number]  Referring Provider: Jodi Marble, MD  DOB: [birth year]  PCP: Jodi Marble, MD  DOS: 03/31/2019  Note by: Jodi Santa, MD  Service setting: Ambulatory outpatient  Specialty: Interventional Pain Management  Patient type: Established  Location: [facility] (AMB) Pain Management Facility  Visit type: Interventional Procedure   Primary Reason for Visit: Interventional Pain Management Treatment. CC: Knee Pain (left)  Procedure:          Anesthesia, Analgesia, Anxiolysis:  Type: Genicular Nerves Block (Superior-lateral, Superior-medial, and Inferior-medial Genicular Nerves) #1  CPT: 13244      Primary Purpose: Diagnostic Region: Lateral, Anterior, and Medial aspects of the knee joint, above and below the knee joint proper. Level: Superior and inferior to the knee joint. Target Area: For Genicular Nerve block(s), the targets are: the superior-lateral genicular nerve, located in the lateral distal portion of the femoral shaft as it curves to form the lateral epicondyle, in the region of the distal femoral metaphysis; the superior-medial genicular nerve, located in the medial distal portion of the femoral shaft as it curves to form the medial epicondyle; and the inferior-medial genicular nerve, located in the medial, proximal portion of the tibial shaft, as it curves to form the medial epicondyle, in the region of the proximal tibial metaphysis. Approach: Anterior, percutaneous, ipsilateral approach. Laterality: Left knee  Type: Local Anesthesia  Local Anesthetic: Lidocaine 1-2%  Position: Modified Fowler's position with pillows under the targeted knee(s).   Indications: 1. Primary osteoarthritis of left knee   2. Chronic pain of left knee   3. Chronic pain syndrome    Pain Score: Pre-procedure: 10-Worst pain ever/10 Post-procedure: 6/10  Pre-op Assessment:  [name] is a 68 y.o. (year old), female patient, seen today for interventional  treatment. She  has a past surgical history that includes Cesarean section; Wisdom tooth extraction; Colonoscopy; and Knee arthroscopy with meniscal repair (Left, 09/26/2015). [name] has a current medication list which includes the following prescription(s): alprazolam, chlorthalidone, cholecalciferol, ezetimibe, naproxen, ramipril, alendronate, diclofenac sodium, esomeprazole, hydrocodone-acetaminophen, and krill oil. Her primarily concern today is the Knee Pain (left)  Initial Vital Signs:  Pulse/HCG Rate: 66ECG Heart Rate: 60 Temp: 98.2 F (36.8 C) Resp: 15 BP: 122/77 SpO2: 100 %  BMI: Estimated body mass index is 23.91 kg/m as calculated from the following:   Height as of this encounter: 5\' 3"  (1.6 m).   Weight as of this encounter: 135 lb (61.2 kg).  Risk Assessment: Allergies: Reviewed. She is allergic to aspirin; latex; penicillins; and sulfa antibiotics.  Allergy Precautions: None required Coagulopathies: Reviewed. None identified.  Blood-thinner therapy: None at this time Active Infection(s): Reviewed. None identified. [name] is afebrile  Site Confirmation: [name] was asked to confirm the procedure and laterality before marking the site Procedure checklist: Completed Consent: Before the procedure and under the influence of no sedative(s), amnesic(s), or anxiolytics, the patient was informed of the treatment options, risks and possible complications. To fulfill our ethical and legal obligations, as recommended by the American Medical Association's Code of Ethics, I have informed the patient of my clinical impression; the nature and purpose of the treatment or procedure; the risks, benefits, and possible complications of the intervention; the alternatives, including doing nothing; the risk(s) and benefit(s) of the alternative treatment(s) or procedure(s); and the risk(s) and benefit(s) of doing nothing. The patient was provided information about the general risks and  possible complications associated with the procedure. These may include, but are  not limited to: failure to achieve desired goals, infection, bleeding, organ or nerve damage, allergic reactions, paralysis, and death. In addition, the patient was informed of those risks and complications associated to the procedure, such as failure to decrease pain; infection; bleeding; organ or nerve damage with subsequent damage to sensory, motor, and/or autonomic systems, resulting in permanent pain, numbness, and/or weakness of one or several areas of the body; allergic reactions; (i.e.: anaphylactic reaction); and/or death. Furthermore, the patient was informed of those risks and complications associated with the medications. These include, but are not limited to: allergic reactions (i.e.: anaphylactic or anaphylactoid reaction(s)); adrenal axis suppression; blood sugar elevation that in diabetics may result in ketoacidosis or comma; water retention that in patients with history of congestive heart failure may result in shortness of breath, pulmonary edema, and decompensation with resultant heart failure; weight gain; swelling or edema; medication-induced neural toxicity; particulate matter embolism and blood vessel occlusion with resultant organ, and/or nervous system infarction; and/or aseptic necrosis of one or more joints. Finally, the patient was informed that Medicine is not an exact science; therefore, there is also the possibility of unforeseen or unpredictable risks and/or possible complications that may result in a catastrophic outcome. The patient indicated having understood very clearly. We have given the patient no guarantees and we have made no promises. Enough time was given to the patient to ask questions, all of which were answered to the patient's satisfaction. Jodi Goodman has indicated that she wanted to continue with the procedure. Attestation: I, the ordering provider, attest that I have discussed with  the patient the benefits, risks, side-effects, alternatives, likelihood of achieving goals, and potential problems during recovery for the procedure that I have provided informed consent. Date   Time: 03/31/2019 10:21 AM  Pre-Procedure Preparation:  Monitoring: As per clinic protocol. Respiration, ETCO2, SpO2, BP, heart rate and rhythm monitor placed and checked for adequate function Safety Precautions: Patient was assessed for positional comfort and pressure points before starting the procedure. Time-out: I initiated and conducted the "Time-out" before starting the procedure, as per protocol. The patient was asked to participate by confirming the accuracy of the "Time Out" information. Verification of the correct person, site, and procedure were performed and confirmed by me, the nursing staff, and the patient. "Time-out" conducted as per Joint Commission's Universal Protocol (UP.01.01.01). Time: 1140  Description of Procedure:          Area Prepped: Entire knee area, from mid-thigh to mid-shin, lateral, anterior, and medial aspects. Prepping solution: DuraPrep (Iodine Povacrylex [0.7% available iodine] and Isopropyl Alcohol, 74% w/w) Safety Precautions: Aspiration looking for blood return was conducted prior to all injections. At no point did we inject any substances, as a needle was being advanced. No attempts were made at seeking any paresthesias. Safe injection practices and needle disposal techniques used. Medications properly checked for expiration dates. SDV (single dose vial) medications used. Description of the Procedure: Protocol guidelines were followed. The patient was placed in position over the procedure table. The target area was identified and the area prepped in the usual manner. Skin & deeper tissues infiltrated with local anesthetic. Appropriate amount of time allowed to pass for local anesthetics to take effect. The procedure needles were then advanced to the target area. Proper  needle placement secured. Negative aspiration confirmed. Solution injected in intermittent fashion, asking for systemic symptoms every 0.5cc of injectate. The needles were then removed and the area cleansed, making sure to leave some of the prepping solution back to  take advantage of its long term bactericidal properties.  Vitals:   03/31/19 1141 03/31/19 1145 03/31/19 1150 03/31/19 1154  BP: (!) 143/89 (!) 148/88 (!) 148/98 (!) 144/89  Pulse:      Resp: 15 15 18 14   Temp:      TempSrc:      SpO2: 100% 99% 99% 97%  Weight:      Height:        Start Time: 1140 hrs. End Time: 1154 hrs. Materials:  Needle(s) Type: Spinal Needle Gauge: 22G Length: 3.5-in Medication(s): Please see orders for medications and dosing details. 5 cc solution made of 4 cc of 0.2% ropivacaine, 1 cc of Decadron 10 mg/cc.  1.5 cc injected at each level above on the left.   Imaging Guidance (Non-Spinal):          Type of Imaging Technique: Fluoroscopy Guidance (Non-Spinal) Indication(s): Assistance in needle guidance and placement for procedures requiring needle placement in or near specific anatomical locations not easily accessible without such assistance. Exposure Time: Please see nurses notes. Contrast: None used. Fluoroscopic Guidance: I was personally present during the use of fluoroscopy. "Tunnel Vision Technique" used to obtain the best possible view of the target area. Parallax error corrected before commencing the procedure. "Direction-depth-direction" technique used to introduce the needle under continuous pulsed fluoroscopy. Once target was reached, antero-posterior, oblique, and lateral fluoroscopic projection used confirm needle placement in all planes. Images permanently stored in EMR. Interpretation: No contrast injected. I personally interpreted the imaging intraoperatively. Adequate needle placement confirmed in multiple planes. Permanent images saved into the patient's record.  Antibiotic  Prophylaxis:   Anti-infectives (From admission, onward)   None     Indication(s): None identified  Post-operative Assessment:  Post-procedure Vital Signs:  Pulse/HCG Rate: 6660 Temp: 98.2 F (36.8 C) Resp: 14 BP: (!) 144/89 SpO2: 97 %  EBL: None  Complications: No immediate post-treatment complications observed by team, or reported by patient.  Note: The patient tolerated the entire procedure well. A repeat set of vitals were taken after the procedure and the patient was kept under observation following institutional policy, for this type of procedure. Post-procedural neurological assessment was performed, showing return to baseline, prior to discharge. The patient was provided with post-procedure discharge instructions, including a section on how to identify potential problems. Should any problems arise concerning this procedure, the patient was given instructions to immediately contact us, at any time, without hesitation. In any case, we plan to contact the patient by telephone for a follow-up status report regarding this interventional procedure.  Comments:  No additional relevant information.  Plan of Care  Orders:  Orders Placed This Encounter  Procedures   DG PAIN CLINIC C-ARM 1-60 MIN NO REPORT    Intraoperative [facility].    Standing Status:   Standing    Number of Occurrences:   1    Order Specific Question:   Reason for exam:    Answer:   Assistance in needle guidance and placement for procedures requiring needle placement in or near specific anatomical locations not easily accessible without such assistance.   Medications ordered for procedure: Meds ordered this encounter  Medications   dexamethasone (DECADRON) injection 10 mg   ropivacaine (PF) 2 mg/mL (0.2%) (NAROPIN) injection 2 mL   lidocaine (XYLOCAINE) 2 % (with pres) injection 400 mg   Medications administered: We administered dexamethasone,  ropivacaine (PF) 2 mg/mL (0.2%), and lidocaine.  See the medical record for exact dosing, route,  and time of administration.  Disposition: Discharge home  Discharge Date & Time: 03/31/2019; 1200 hrs.   Follow-up plan:   Return in about 5 weeks (around 05/05/2019).     Future Appointments  Date Time Provider Department Center  05/05/2019  1:30 PM Edward JollyLateef, Taya Ashbaugh, MD [facility] None   Primary Care Physician: Sherron Mondayejan-Sie, S Ahmed, MD [facility] Note by: Edward JollyBilal Leviticus Harton, MD Date: 03/31/2019; Time: 3:36 PM  Disclaimer:  Medicine is not an exact science. The only guarantee in medicine is that nothing is guaranteed. It is important to note that the decision to proceed with this intervention was based on the information collected from the patient. The Data and conclusions were drawn from the patient's questionnaire, the interview, and the physical examination. Because the information was provided in large part by the patient, it cannot be guaranteed that it has not been purposely or unconsciously manipulated. Every effort has been made to obtain as much relevant data as possible for this evaluation. It is important to note that the conclusions that lead to this procedure are derived in large part from the available data. Always take into account that the treatment will also be dependent on availability of resources and existing treatment guidelines, considered by other Pain Management Practitioners as being common knowledge and practice, at the time of the intervention. For Medico-Legal purposes, it is also important to point out that variation in procedural techniques and pharmacological choices are the acceptable norm. The indications, contraindications, technique, and results of the above procedure should only be interpreted and judged by a Board-Certified Interventional Pain Specialist with extensive familiarity and expertise in the same exact procedure and technique.

## 2019-04-14 ENCOUNTER — Ambulatory Visit: Payer: Medicare HMO | Admitting: Student in an Organized Health Care Education/Training Program

## 2019-05-04 ENCOUNTER — Encounter: Payer: Self-pay | Admitting: Student in an Organized Health Care Education/Training Program

## 2019-05-04 ENCOUNTER — Telehealth: Payer: Self-pay

## 2019-05-05 ENCOUNTER — Ambulatory Visit
Payer: Medicare HMO | Attending: Student in an Organized Health Care Education/Training Program | Admitting: Student in an Organized Health Care Education/Training Program

## 2019-05-05 ENCOUNTER — Other Ambulatory Visit: Payer: Self-pay

## 2019-05-05 DIAGNOSIS — M1712 Unilateral primary osteoarthritis, left knee: Secondary | ICD-10-CM | POA: Diagnosis not present

## 2019-05-05 DIAGNOSIS — G8929 Other chronic pain: Secondary | ICD-10-CM | POA: Diagnosis not present

## 2019-05-05 DIAGNOSIS — M25562 Pain in left knee: Secondary | ICD-10-CM

## 2019-05-05 DIAGNOSIS — G894 Chronic pain syndrome: Secondary | ICD-10-CM | POA: Diagnosis not present

## 2019-05-05 NOTE — Progress Notes (Signed)
Pain Management Virtual Encounter Note - Virtual Visit via Telephone Telehealth (real-time audio visits between healthcare provider and patient).   Patient's Phone No. & Preferred Pharmacy:  [phone] (home); [phone] (mobile); (Preferred) [phone] [email or web address]  [place] ([address]), [place] [address]) [place] [address] Phone: [phone] Fax: [phone]    Pre-screening note:  Our staff contacted Jodi Goodman and offered her an "in person", "face-to-face" appointment versus a telephone encounter. She indicated preferring the telephone encounter, at this time.   Reason for Virtual Visit: COVID-19*  Social distancing based on CDC and AMA recommendations.   I contacted Jodi Goodman on 05/05/2019 via telephone.      I clearly identified myself as Gillis Santa, MD. I verified that I was speaking with the correct person using two identifiers (Name: Jodi Goodman, and date of birth: 1951/01/25).  Advanced Informed Consent I sought verbal advanced consent from Jodi Goodman for virtual visit interactions. I informed [name] of possible security and privacy concerns, risks, and limitations associated with providing "not-in-person" medical evaluation and management services. I also informed [name] of the availability of "in-person" appointments. Finally, I informed her that there would be a charge for the virtual visit and that she could be  personally, fully or partially, financially responsible for it. [name] expressed understanding and agreed to proceed.   Historic Elements   Jodi Goodman is a 68 y.o. year old, female patient evaluated today after her last encounter by our practice on 05/04/2019. [name]  has a past medical history of Anemia, Anxiety, Arthritis, Bruises easily, Dysrhythmia, GERD (gastroesophageal reflux disease), Hyperlipidemia, and Hypertension. She also   has a past surgical history that includes Cesarean section; Wisdom tooth extraction; Colonoscopy; and Knee arthroscopy with meniscal repair (Left, 09/26/2015). [name] has a current medication list which includes the following prescription(s): alprazolam, chlorthalidone, cholecalciferol, ezetimibe, krill oil, ramipril, alendronate, diclofenac sodium, esomeprazole, hydrocodone-acetaminophen, and naproxen. She  reports that she has never smoked. She has never used smokeless tobacco. She reports that she does not drink alcohol or use drugs. [name] is allergic to aspirin; latex; penicillins; and sulfa antibiotics.   HPI  Today, she is being contacted for a post-procedure assessment.  Evaluation of last interventional procedure  03/31/2019 Procedure: Left knee genicular nerve block Pre-procedure pain score:  10/10 Post-procedure pain score: 6/10         Influential Factors: Intra-procedural challenges: None observed.         Reported side-effects: None.        Post-procedural adverse reactions or complications: None reported         Sedation: Please see nurses note for DOS. When no sedatives are used, the analgesic levels obtained are directly associated to the effectiveness of the local anesthetics. However, when sedation is provided, the level of analgesia obtained during the initial 1 hour following the intervention, is believed to be the result of a combination of factors. These factors may include, but are not limited to: 1. The effectiveness of the local anesthetics used. 2. The effects of the analgesic(s) and/or anxiolytic(s) used. 3. The degree of discomfort experienced by the patient at the time of the procedure. 4. The patients ability and reliability in recalling and recording the events. 5. The presence and influence of possible secondary gains and/or psychosocial factors. Reported result: Relief experienced during the 1st hour after the procedure: 100 % (Ultra-Short Term Relief)  Interpretative annotation: Clinically appropriate result. Analgesia during this period is likely to be Local Anesthetic and/or IV Sedative (Analgesic/Anxiolytic) related.          Effects of local anesthetic: The analgesic effects attained during this period are directly associated to the localized infiltration of local anesthetics and therefore cary significant diagnostic value as to the etiological location, or anatomical origin, of the pain. Expected duration of relief is directly dependent on the pharmacodynamics of the local anesthetic used. Long-acting (4-6 hours) anesthetics used.  Reported result: Relief during the next 4 to 6 hour after the procedure: 100 % (Short-Term Relief)            Interpretative annotation: Clinically appropriate result. Analgesia during this period is likely to be Local Anesthetic-related.          Long-term benefit: Defined as the period of time past the expected duration of local anesthetics (1 hour for short-acting and 4-6 hours for long-acting). With the possible exception of prolonged sympathetic blockade from the local anesthetics, benefits during this period are typically attributed to, or associated with, other factors such as analgesic sensory neuropraxia, antiinflammatory effects, or beneficial biochemical changes provided by agents other than the local anesthetics.  Reported result: Extended relief following procedure: 40 %(lasted 3 weeks) (Long-Term Relief)            Interpretative annotation: Clinically appropriate result. Good relief. No permanent benefit expected. Inflammation plays a part in the etiology to the pain.          Plan: LEFT genicular nerve RFA Pertinent Labs   SAFETY SCREENING Profile Lab Results  Component Value Date   SARSCOV2NAA NOT DETECTED 03/26/2019   COVIDSOURCE NASOPHARYNGEAL 03/26/2019   Renal Function Lab Results  Component Value Date   BUN 20 (H) 11/02/2014   CREATININE 0.68 11/02/2014   GFRAA >60  11/02/2014   GFRNONAA >60 11/02/2014   Hepatic Function No results found for: AST, ALT, ALBUMIN UDS No results found for: SUMMARY Note: Above Lab results reviewed.  Recent imaging  DG PAIN CLINIC C-ARM 1-60 MIN NO REPORT Fluoro was used, but no Radiologist interpretation will be provided.  Please refer to "NOTES" tab for provider progress note.  Assessment  The primary encounter diagnosis was Primary osteoarthritis of left knee. Diagnoses of Chronic pain of left knee and Chronic pain syndrome were also pertinent to this visit.  Plan of Care  I am having Aralyn Tencza maintain her alendronate, ALPRAZolam, chlorthalidone, Cholecalciferol (VITAMIN D3 PO), diclofenac sodium, esomeprazole, KRILL OIL PO, naproxen, ramipril, ezetimibe, and HYDROcodone-acetaminophen.  S/p diagnostic LEFT knee genicular NB which was effective for left knee pain for 3 weeks, discussed genicular RFA for left knee. Risks and benefits reviewed. Encouraged patient to reach out to insurance to determine patient cost.  Follow-up plan:   Return for Procedure: Left knee genicular RFA w sedation.     s/p left knee genicular NB on 03/31/2019; effective   Recent Visits Date Type Provider Dept  03/31/19 Procedure visit Edward JollyLateef, Jarel Cuadra, MD [facility]  02/11/19 Office Visit Edward JollyLateef, Manali Mcelmurry, MD [facility]  Showing recent visits within past 90 days and meeting all other requirements   Today's Visits Date Type Provider Dept  05/05/19 Office Visit Edward JollyLateef, Jazzmen Restivo, MD [facility]  Showing today's visits and meeting all other requirements   Future Appointments No visits were found meeting these conditions.  Showing future appointments within next 90 days and meeting all other requirements   I discussed the assessment and  treatment plan with the patient. The patient was provided an opportunity to ask questions and all were answered. The patient agreed with the plan and demonstrated an  understanding of the instructions.  Patient advised to call back or seek an in-person evaluation if the symptoms or condition worsens.  Total duration of non-face-to-face encounter: 25 minutes. Time Note: Greater than 50% of the 25 minute spent with [name], was spent in counseling/coordination of care regarding: Ms. Dragovich's primary cause of pain, the treatment plan, treatment alternatives, the risks and possible complications of proposed treatment, going over the informed consent, the results, interpretation and significance of  her recent diagnostic interventional treatment(s) and realistic expectations.  Note by: Edward JollyBilal Karrah Mangini, MD Date: 05/05/2019; Time: 2:33 PM  Note: This dictation was prepared with Dragon dictation. Any transcriptional errors that may result from this process are unintentional.  Disclaimer:  * Given the special circumstances of the COVID-19 pandemic, the federal government has announced that the Office for Civil Rights (OCR) will exercise its enforcement discretion and will not impose penalties on physicians using telehealth in the event of noncompliance with regulatory requirements under the [employer] Insurance Portability and Accountability Act (HIPAA) in connection with the good faith provision of telehealth during the COVID-19 national public health emergency. (AMA)

## 2019-07-30 ENCOUNTER — Encounter
Admission: RE | Admit: 2019-07-30 | Discharge: 2019-07-30 | Disposition: A | Discharge status: Home / Self Care | Payer: Medicare HMO | Source: Ambulatory Visit | Attending: Surgery | Admitting: Surgery

## 2019-07-30 ENCOUNTER — Other Ambulatory Visit: Payer: Self-pay

## 2019-07-30 DIAGNOSIS — Z91040 Latex allergy status: Secondary | ICD-10-CM | POA: Diagnosis not present

## 2019-07-30 DIAGNOSIS — M1712 Unilateral primary osteoarthritis, left knee: Secondary | ICD-10-CM | POA: Diagnosis not present

## 2019-07-30 DIAGNOSIS — I10 Essential (primary) hypertension: Secondary | ICD-10-CM | POA: Insufficient documentation

## 2019-07-30 DIAGNOSIS — Z01812 Encounter for preprocedural laboratory examination: Secondary | ICD-10-CM | POA: Insufficient documentation

## 2019-07-30 DIAGNOSIS — Z882 Allergy status to sulfonamides status: Secondary | ICD-10-CM | POA: Insufficient documentation

## 2019-07-30 DIAGNOSIS — R001 Bradycardia, unspecified: Secondary | ICD-10-CM | POA: Diagnosis not present

## 2019-07-30 DIAGNOSIS — Z880 Allergy status to penicillin: Secondary | ICD-10-CM | POA: Diagnosis not present

## 2019-07-30 DIAGNOSIS — Z886 Allergy status to analgesic agent status: Secondary | ICD-10-CM | POA: Diagnosis not present

## 2019-07-30 LAB — CBC
HCT: 38.7 % (ref 36.0–46.0)
Hemoglobin: 12.8 g/dL (ref 12.0–15.0)
MCH: 30.3 pg (ref 26.0–34.0)
MCHC: 33.1 g/dL (ref 30.0–36.0)
MCV: 91.5 fL (ref 80.0–100.0)
Platelets: 292 10*3/uL (ref 150–400)
RBC: 4.23 MIL/uL (ref 3.87–5.11)
RDW: 12.5 % (ref 11.5–15.5)
WBC: 6.4 10*3/uL (ref 4.0–10.5)
nRBC: 0 % (ref 0.0–0.2)

## 2019-07-30 LAB — URINALYSIS, ROUTINE W REFLEX MICROSCOPIC
Bacteria, UA: NONE SEEN
Bilirubin Urine: NEGATIVE
Glucose, UA: NEGATIVE mg/dL
Ketones, ur: NEGATIVE mg/dL
Nitrite: NEGATIVE
Protein, ur: NEGATIVE mg/dL
Specific Gravity, Urine: 1.017 (ref 1.005–1.030)
pH: 6 (ref 5.0–8.0)

## 2019-07-30 LAB — TYPE AND SCREEN
ABO/RH(D): O POS
Antibody Screen: NEGATIVE

## 2019-07-30 LAB — SURGICAL PCR SCREEN
MRSA, PCR: NEGATIVE
Staphylococcus aureus: NEGATIVE

## 2019-07-30 LAB — BASIC METABOLIC PANEL
Anion gap: 11 (ref 5–15)
BUN: 19 mg/dL (ref 8–23)
CO2: 27 mmol/L (ref 22–32)
Calcium: 10 mg/dL (ref 8.9–10.3)
Chloride: 101 mmol/L (ref 98–111)
Creatinine, Ser: 0.61 mg/dL (ref 0.44–1.00)
GFR calc Af Amer: >60 mL/min (ref >60)
GFR calc non Af Amer: >60 mL/min (ref >60)
Glucose, Bld: 104 mg/dL — ABNORMAL HIGH (ref 70–99)
Potassium: 3.2 mmol/L — ABNORMAL LOW (ref 3.5–5.1)
Sodium: 139 mmol/L (ref 135–145)

## 2019-07-30 NOTE — Pre-Procedure Instructions (Signed)
Faxed K+ of 3.2 to Dr, Shubuta office.  Patient notified to add potassium rich foods to her diet.

## 2019-07-30 NOTE — Patient Instructions (Signed)
Your procedure is scheduled on:  Tues 10/20 Report to Day Surgery. To find out your arrival time please call [phone] between 1PM - 3PM on Mon. 10/19  Remember: Instructions that are not followed completely may result in serious medical risk,  up to and including death, or upon the discretion of your surgeon and anesthesiologist your  surgery may need to be rescheduled.     _X__ 1. Do not eat food after midnight the night before your procedure.                 No gum chewing or hard candies. You may drink clear liquids up to 2 hours                 before you are scheduled to arrive for your surgery- DO not drink clear                 liquids within 2 hours of the start of your surgery.                 Clear Liquids include:  water, apple juice without pulp, clear carbohydrate                 drink such as Clearfast of Gatorade, Black Coffee or Tea (Do not add                 anything to coffee or tea).  __X__2.  On the morning of surgery brush your teeth with toothpaste and water, you                may rinse your mouth with mouthwash if you wish.  Do not swallow any toothpaste of mouthwash.     _X__ 3.  No Alcohol for 24 hours before or after surgery.   ___ 4.  Do Not Smoke or use e-cigarettes For 24 Hours Prior to Your Surgery.                 Do not use any chewable tobacco products for at least 6 hours prior to                 surgery.  ____  5.  Bring all medications with you on the day of surgery if instructed.   __x__  6.  Notify your doctor if there is any change in your medical condition      (cold, fever, infections).     Do not wear jewelry, make-up, hairpins, clips or nail polish. Do not wear lotions, powders, or perfumes. You may wear deodorant. Do not shave 48 hours prior to surgery. Men may shave face and neck. Do not bring valuables to the hospital.    [facility] is not responsible for any belongings or valuables.  Contacts, dentures  or bridgework may not be worn into surgery. Leave your suitcase in the car. After surgery it may be brought to your room. For patients admitted to the hospital, discharge time is determined by your treatment team.   Patients discharged the day of surgery will not be allowed to drive home.   Please read over the following fact sheets that you were given:   __x__ Take these medicines the morning of surgery with A SIP OF WATER:    1. ALPRAZolam (XANAX) 0.25 MG tablet if needed  2. famotidine (PEPCID) 20 MG tablet take dose the night before and morning of.  3.   4.  5.  6.  ____ Fleet Enema (  as directed)   __x__ Use CHG Soap as directed  ____ Use inhalers on the day of surgery  ____ Stop metformin 2 days prior to surgery    ____ Take 1/2 of usual insulin dose the night before surgery. No insulin the morning          of surgery.   ____ Stop Coumadin/Plavix/aspirin on   __x__ Stop Anti-inflammatories Aleve or ibuprofen 10/13         May take tylenol    Maximum dose is 4000mg  in 24 hour       __x__ Stop  until after surgery.  Topical pain relieve     Hempvan, ASPERCREME LIDOCAINE EX  ____ Bring C-Pap to the hospital.

## 2019-07-31 LAB — URINE CULTURE
Culture: <10000 — AB
Special Requests: NORMAL

## 2019-08-06 ENCOUNTER — Other Ambulatory Visit: Payer: Self-pay

## 2019-08-06 ENCOUNTER — Other Ambulatory Visit
Admission: RE | Admit: 2019-08-06 | Discharge: 2019-08-06 | Disposition: A | Discharge status: Home / Self Care | Payer: Medicare HMO | Source: Ambulatory Visit | Attending: Surgery | Admitting: Surgery

## 2019-08-06 DIAGNOSIS — Z01812 Encounter for preprocedural laboratory examination: Secondary | ICD-10-CM | POA: Insufficient documentation

## 2019-08-06 DIAGNOSIS — Z20828 Contact with and (suspected) exposure to other viral communicable diseases: Secondary | ICD-10-CM | POA: Insufficient documentation

## 2019-08-06 LAB — SARS CORONAVIRUS 2 (TAT 6-24 HRS): SARS Coronavirus 2: NEGATIVE

## 2019-08-10 ENCOUNTER — Other Ambulatory Visit: Payer: Self-pay

## 2019-08-10 ENCOUNTER — Encounter
Admission: RE | Disposition: A | Discharge status: Home Health | Payer: Self-pay | Source: Home / Self Care | Attending: Surgery

## 2019-08-10 ENCOUNTER — Inpatient Hospital Stay
Admission: RE | Admit: 2019-08-10 | Discharge: 2019-08-13 | DRG: 470 | Disposition: A | Discharge status: Home Health | Payer: Medicare HMO | Attending: Surgery | Admitting: Surgery

## 2019-08-10 ENCOUNTER — Inpatient Hospital Stay: Payer: Medicare HMO | Admitting: Certified Registered Nurse Anesthetist

## 2019-08-10 ENCOUNTER — Inpatient Hospital Stay: Payer: Medicare HMO

## 2019-08-10 DIAGNOSIS — E876 Hypokalemia: Secondary | ICD-10-CM | POA: Diagnosis present

## 2019-08-10 DIAGNOSIS — R509 Fever, unspecified: Secondary | ICD-10-CM | POA: Diagnosis not present

## 2019-08-10 DIAGNOSIS — I10 Essential (primary) hypertension: Secondary | ICD-10-CM | POA: Diagnosis present

## 2019-08-10 DIAGNOSIS — G894 Chronic pain syndrome: Secondary | ICD-10-CM | POA: Diagnosis present

## 2019-08-10 DIAGNOSIS — E785 Hyperlipidemia, unspecified: Secondary | ICD-10-CM | POA: Diagnosis present

## 2019-08-10 DIAGNOSIS — Z20828 Contact with and (suspected) exposure to other viral communicable diseases: Secondary | ICD-10-CM | POA: Diagnosis present

## 2019-08-10 DIAGNOSIS — M1712 Unilateral primary osteoarthritis, left knee: Secondary | ICD-10-CM | POA: Diagnosis present

## 2019-08-10 DIAGNOSIS — F419 Anxiety disorder, unspecified: Secondary | ICD-10-CM | POA: Diagnosis present

## 2019-08-10 DIAGNOSIS — Z96652 Presence of left artificial knee joint: Secondary | ICD-10-CM

## 2019-08-10 DIAGNOSIS — K219 Gastro-esophageal reflux disease without esophagitis: Secondary | ICD-10-CM | POA: Diagnosis present

## 2019-08-10 HISTORY — PX: TOTAL KNEE ARTHROPLASTY: SHX125

## 2019-08-10 LAB — POCT I-STAT, CHEM 8
BUN: 18 mg/dL (ref 8–23)
Calcium, Ion: 1.31 mmol/L (ref 1.15–1.40)
Chloride: 104 mmol/L (ref 98–111)
Creatinine, Ser: 0.8 mg/dL (ref 0.44–1.00)
Glucose, Bld: 91 mg/dL (ref 70–99)
HCT: 38 % (ref 36.0–46.0)
Hemoglobin: 12.9 g/dL (ref 12.0–15.0)
Potassium: 3.3 mmol/L — ABNORMAL LOW (ref 3.5–5.1)
Sodium: 143 mmol/L (ref 135–145)
TCO2: 25 mmol/L (ref 22–32)

## 2019-08-10 LAB — ABO/RH: ABO/RH(D): O POS

## 2019-08-10 SURGERY — ARTHROPLASTY, KNEE, TOTAL
Anesthesia: Spinal | Site: Knee | Laterality: Left

## 2019-08-10 MED ORDER — ADULT MULTIVITAMIN W/MINERALS CH
1.0000 | ORAL_TABLET | Freq: Every day | ORAL | Status: DC
  Administered 2019-08-11: 08:00:00 1 via ORAL
  Filled 2019-08-10 (×2): qty 1

## 2019-08-10 MED ORDER — ONDANSETRON HCL 4 MG/2ML IJ SOLN
4.0000 mg | Freq: Four times a day (QID) | INTRAMUSCULAR | Status: DC | PRN
  Administered 2019-08-10: 4 mg via INTRAVENOUS
  Filled 2019-08-10: qty 2

## 2019-08-10 MED ORDER — ALPRAZOLAM 0.25 MG PO TABS
0.2500 mg | ORAL_TABLET | Freq: Two times a day (BID) | ORAL | Status: DC | PRN
  Administered 2019-08-11: 13:00:00 0.25 mg via ORAL
  Filled 2019-08-10: qty 1

## 2019-08-10 MED ORDER — PHENYLEPHRINE HCL (PRESSORS) 10 MG/ML IV SOLN
INTRAVENOUS | Status: AC
  Filled 2019-08-10: qty 2

## 2019-08-10 MED ORDER — DOCUSATE SODIUM 100 MG PO CAPS
100.0000 mg | ORAL_CAPSULE | Freq: Two times a day (BID) | ORAL | Status: DC
  Administered 2019-08-10 – 2019-08-13 (×6): 100 mg via ORAL
  Filled 2019-08-10 (×6): qty 1

## 2019-08-10 MED ORDER — SODIUM CHLORIDE 0.9 % IV SOLN
INTRAVENOUS | Status: DC | PRN
  Administered 2019-08-10: 60 mL

## 2019-08-10 MED ORDER — BUPIVACAINE HCL (PF) 0.5 % IJ SOLN
INTRAMUSCULAR | Status: DC | PRN
  Administered 2019-08-10: 3 mL

## 2019-08-10 MED ORDER — CHLORTHALIDONE 25 MG PO TABS
12.5000 mg | ORAL_TABLET | Freq: Every day | ORAL | Status: DC
  Administered 2019-08-11 – 2019-08-12 (×2): 12.5 mg via ORAL
  Filled 2019-08-10 (×4): qty 0.5

## 2019-08-10 MED ORDER — FENTANYL CITRATE (PF) 100 MCG/2ML IJ SOLN
INTRAMUSCULAR | Status: DC | PRN
  Administered 2019-08-10: 50 ug via INTRAVENOUS
  Administered 2019-08-10 (×2): 25 ug via INTRAVENOUS

## 2019-08-10 MED ORDER — MIDAZOLAM HCL 2 MG/2ML IJ SOLN
INTRAMUSCULAR | Status: AC
  Filled 2019-08-10: qty 2

## 2019-08-10 MED ORDER — METOCLOPRAMIDE HCL 5 MG/ML IJ SOLN
5.0000 mg | Freq: Three times a day (TID) | INTRAMUSCULAR | Status: DC | PRN
  Administered 2019-08-10: 14:00:00 10 mg via INTRAVENOUS
  Filled 2019-08-10: qty 2

## 2019-08-10 MED ORDER — FLEET ENEMA 7-19 GM/118ML RE ENEM: 1.0000 | ENEMA | Freq: Once | RECTAL | Status: DC | PRN

## 2019-08-10 MED ORDER — BUPIVACAINE LIPOSOME 1.3 % IJ SUSP
INTRAMUSCULAR | Status: AC
  Filled 2019-08-10: qty 20

## 2019-08-10 MED ORDER — KETOROLAC TROMETHAMINE 30 MG/ML IJ SOLN
30.0000 mg | Freq: Once | INTRAMUSCULAR | Status: AC
  Administered 2019-08-10: 11:00:00 30 mg via INTRAVENOUS

## 2019-08-10 MED ORDER — EZETIMIBE 10 MG PO TABS
10.0000 mg | ORAL_TABLET | Freq: Every day | ORAL | Status: DC
  Administered 2019-08-10 – 2019-08-12 (×3): 10 mg via ORAL
  Filled 2019-08-10 (×3): qty 1

## 2019-08-10 MED ORDER — GLYCOPYRROLATE 0.2 MG/ML IJ SOLN
INTRAMUSCULAR | Status: AC
  Filled 2019-08-10: qty 1

## 2019-08-10 MED ORDER — KETOROLAC TROMETHAMINE 30 MG/ML IJ SOLN
INTRAMUSCULAR | Status: AC
  Administered 2019-08-10: 30 mg via INTRAVENOUS
  Filled 2019-08-10: qty 1

## 2019-08-10 MED ORDER — ENOXAPARIN SODIUM 40 MG/0.4ML ~~LOC~~ SOLN
40.0000 mg | SUBCUTANEOUS | Status: DC
  Administered 2019-08-11 – 2019-08-13 (×3): 40 mg via SUBCUTANEOUS
  Filled 2019-08-10 (×3): qty 0.4

## 2019-08-10 MED ORDER — METOCLOPRAMIDE HCL 10 MG PO TABS: 5.0000 mg | ORAL_TABLET | Freq: Three times a day (TID) | ORAL | Status: DC | PRN

## 2019-08-10 MED ORDER — CALCIUM CARBONATE ANTACID 500 MG PO CHEW
1.0000 | CHEWABLE_TABLET | Freq: Three times a day (TID) | ORAL | Status: DC | PRN
  Filled 2019-08-10: qty 1

## 2019-08-10 MED ORDER — TRAMADOL HCL 50 MG PO TABS
50.0000 mg | ORAL_TABLET | Freq: Four times a day (QID) | ORAL | Status: DC | PRN
  Administered 2019-08-11 – 2019-08-13 (×4): 50 mg via ORAL
  Filled 2019-08-10 (×5): qty 1

## 2019-08-10 MED ORDER — ACETAMINOPHEN 500 MG PO TABS
1000.0000 mg | ORAL_TABLET | Freq: Four times a day (QID) | ORAL | Status: AC
  Administered 2019-08-10 – 2019-08-11 (×2): 1000 mg via ORAL
  Filled 2019-08-10 (×5): qty 2

## 2019-08-10 MED ORDER — LACTATED RINGERS IV SOLN
INTRAVENOUS | Status: DC
  Administered 2019-08-10 (×3): via INTRAVENOUS

## 2019-08-10 MED ORDER — BUPIVACAINE HCL (PF) 0.5 % IJ SOLN
INTRAMUSCULAR | Status: AC
  Filled 2019-08-10: qty 30

## 2019-08-10 MED ORDER — HYDROMORPHONE HCL 1 MG/ML IJ SOLN: 0.2500 mg | INTRAMUSCULAR | Status: DC | PRN

## 2019-08-10 MED ORDER — FENTANYL CITRATE (PF) 100 MCG/2ML IJ SOLN
INTRAMUSCULAR | Status: AC
  Filled 2019-08-10: qty 2

## 2019-08-10 MED ORDER — PHENYLEPHRINE HCL (PRESSORS) 10 MG/ML IV SOLN
INTRAVENOUS | Status: DC | PRN
  Administered 2019-08-10: 100 ug via INTRAVENOUS

## 2019-08-10 MED ORDER — ACETAMINOPHEN 325 MG PO TABS
325.0000 mg | ORAL_TABLET | Freq: Four times a day (QID) | ORAL | Status: DC | PRN
  Administered 2019-08-11 (×2): 325 mg via ORAL
  Filled 2019-08-10 (×2): qty 1

## 2019-08-10 MED ORDER — ADULT GUMMY PO CHEW: CHEWABLE_TABLET | Freq: Every day | ORAL | Status: DC

## 2019-08-10 MED ORDER — DEXAMETHASONE SODIUM PHOSPHATE 10 MG/ML IJ SOLN
INTRAMUSCULAR | Status: AC
  Filled 2019-08-10: qty 1

## 2019-08-10 MED ORDER — GLYCOPYRROLATE 0.2 MG/ML IJ SOLN
INTRAMUSCULAR | Status: DC | PRN
  Administered 2019-08-10: 0.2 mg via INTRAVENOUS

## 2019-08-10 MED ORDER — ACETAMINOPHEN 10 MG/ML IV SOLN
INTRAVENOUS | Status: DC | PRN
  Administered 2019-08-10: 1000 mg via INTRAVENOUS

## 2019-08-10 MED ORDER — KETOROLAC TROMETHAMINE 15 MG/ML IJ SOLN
15.0000 mg | Freq: Four times a day (QID) | INTRAMUSCULAR | Status: AC
  Administered 2019-08-10 – 2019-08-11 (×3): 15 mg via INTRAVENOUS
  Filled 2019-08-10 (×5): qty 1

## 2019-08-10 MED ORDER — CLINDAMYCIN PHOSPHATE 900 MG/50ML IV SOLN
INTRAVENOUS | Status: AC
  Filled 2019-08-10: qty 50

## 2019-08-10 MED ORDER — BUPIVACAINE-EPINEPHRINE (PF) 0.5% -1:200000 IJ SOLN
INTRAMUSCULAR | Status: DC | PRN
  Administered 2019-08-10: 30 mL

## 2019-08-10 MED ORDER — POLYETHYL GLYCOL-PROPYL GLYCOL 0.4-0.3 % OP SOLN: 1.0000 [drp] | Freq: Three times a day (TID) | OPHTHALMIC | Status: DC | PRN

## 2019-08-10 MED ORDER — LIDOCAINE HCL (PF) 2 % IJ SOLN
INTRAMUSCULAR | Status: AC
  Filled 2019-08-10: qty 10

## 2019-08-10 MED ORDER — SODIUM CHLORIDE 0.9 % IV SOLN
INTRAVENOUS | Status: DC
  Administered 2019-08-10: 14:00:00 via INTRAVENOUS

## 2019-08-10 MED ORDER — BISACODYL 10 MG RE SUPP: 10.0000 mg | Freq: Every day | RECTAL | Status: DC | PRN

## 2019-08-10 MED ORDER — EPINEPHRINE PF 1 MG/ML IJ SOLN
INTRAMUSCULAR | Status: AC
  Filled 2019-08-10: qty 1

## 2019-08-10 MED ORDER — TRANEXAMIC ACID 1000 MG/10ML IV SOLN
INTRAVENOUS | Status: DC | PRN
  Administered 2019-08-10: 1000 mg via INTRAVENOUS

## 2019-08-10 MED ORDER — ONDANSETRON HCL 4 MG PO TABS
4.0000 mg | ORAL_TABLET | Freq: Four times a day (QID) | ORAL | Status: DC | PRN
  Administered 2019-08-12: 08:00:00 4 mg via ORAL
  Filled 2019-08-10: qty 1

## 2019-08-10 MED ORDER — PROPOFOL 10 MG/ML IV BOLUS
INTRAVENOUS | Status: DC | PRN
  Administered 2019-08-10: 13 mg via INTRAVENOUS

## 2019-08-10 MED ORDER — ACETAMINOPHEN 10 MG/ML IV SOLN
INTRAVENOUS | Status: AC
  Filled 2019-08-10: qty 100

## 2019-08-10 MED ORDER — RAMIPRIL 10 MG PO CAPS
10.0000 mg | ORAL_CAPSULE | Freq: Every day | ORAL | Status: DC
  Administered 2019-08-10 – 2019-08-12 (×3): 10 mg via ORAL
  Filled 2019-08-10 (×4): qty 1

## 2019-08-10 MED ORDER — PANTOPRAZOLE SODIUM 40 MG PO TBEC
40.0000 mg | DELAYED_RELEASE_TABLET | Freq: Every day | ORAL | Status: DC
  Administered 2019-08-10 – 2019-08-12 (×3): 40 mg via ORAL
  Filled 2019-08-10 (×3): qty 1

## 2019-08-10 MED ORDER — FENTANYL CITRATE (PF) 100 MCG/2ML IJ SOLN: 25.0000 ug | INTRAMUSCULAR | Status: DC | PRN

## 2019-08-10 MED ORDER — CLINDAMYCIN PHOSPHATE 600 MG/50ML IV SOLN
600.0000 mg | Freq: Four times a day (QID) | INTRAVENOUS | Status: AC
  Administered 2019-08-10 – 2019-08-11 (×3): 600 mg via INTRAVENOUS
  Filled 2019-08-10 (×3): qty 50

## 2019-08-10 MED ORDER — DIPHENHYDRAMINE HCL 12.5 MG/5ML PO ELIX: 12.5000 mg | ORAL_SOLUTION | ORAL | Status: DC | PRN

## 2019-08-10 MED ORDER — BUPIVACAINE HCL (PF) 0.5 % IJ SOLN
INTRAMUSCULAR | Status: AC
  Filled 2019-08-10: qty 10

## 2019-08-10 MED ORDER — SODIUM CHLORIDE FLUSH 0.9 % IV SOLN
INTRAVENOUS | Status: AC
  Filled 2019-08-10: qty 40

## 2019-08-10 MED ORDER — MIDAZOLAM HCL 5 MG/5ML IJ SOLN
INTRAMUSCULAR | Status: DC | PRN
  Administered 2019-08-10: 1 mg via INTRAVENOUS

## 2019-08-10 MED ORDER — SODIUM CHLORIDE 0.9 % IV SOLN
INTRAVENOUS | Status: DC | PRN
  Administered 2019-08-10: 30 ug/min via INTRAVENOUS

## 2019-08-10 MED ORDER — TRANEXAMIC ACID 1000 MG/10ML IV SOLN
INTRAVENOUS | Status: AC
  Filled 2019-08-10: qty 10

## 2019-08-10 MED ORDER — MAGNESIUM HYDROXIDE 400 MG/5ML PO SUSP
30.0000 mL | Freq: Every day | ORAL | Status: DC | PRN
  Filled 2019-08-10 (×2): qty 30

## 2019-08-10 MED ORDER — POLYVINYL ALCOHOL 1.4 % OP SOLN
1.0000 [drp] | Freq: Three times a day (TID) | OPHTHALMIC | Status: DC | PRN
  Filled 2019-08-10: qty 15

## 2019-08-10 MED ORDER — CLINDAMYCIN PHOSPHATE 900 MG/50ML IV SOLN
900.0000 mg | Freq: Once | INTRAVENOUS | Status: AC
  Administered 2019-08-10: 08:00:00 900 mg via INTRAVENOUS

## 2019-08-10 MED ORDER — OXYCODONE HCL 5 MG PO TABS
5.0000 mg | ORAL_TABLET | ORAL | Status: DC | PRN
  Administered 2019-08-12: 10 mg via ORAL
  Administered 2019-08-12: 5 mg via ORAL
  Administered 2019-08-13: 04:00:00 10 mg via ORAL
  Filled 2019-08-10: qty 1
  Filled 2019-08-10 (×3): qty 2

## 2019-08-10 MED ORDER — PROPOFOL 500 MG/50ML IV EMUL
INTRAVENOUS | Status: DC | PRN
  Administered 2019-08-10: 50 ug/kg/min via INTRAVENOUS

## 2019-08-10 MED ORDER — PROPOFOL 500 MG/50ML IV EMUL
INTRAVENOUS | Status: AC
  Filled 2019-08-10: qty 50

## 2019-08-10 MED ORDER — ONDANSETRON HCL 4 MG/2ML IJ SOLN: 4.0000 mg | Freq: Once | INTRAMUSCULAR | Status: DC | PRN

## 2019-08-10 SURGICAL SUPPLY — 63 items
BIT DRILL QUICK REL 1/8 2PK SL (DRILL) IMPLANT
BLADE SAW SAG 25X90X1.19 (BLADE) ×3 IMPLANT
BLADE SURG SZ20 CARB STEEL (BLADE) ×3 IMPLANT
BNDG ELASTIC 6X5.8 VLCR NS LF (GAUZE/BANDAGES/DRESSINGS) ×3 IMPLANT
CANISTER SUCT 1200ML W/VALVE (MISCELLANEOUS) ×3 IMPLANT
CANISTER SUCT 3000ML PPV (MISCELLANEOUS) ×3 IMPLANT
CEMENT BONE R 1X40 (Cement) ×6 IMPLANT
CEMENT VACUUM MIXING SYSTEM (MISCELLANEOUS) ×3 IMPLANT
CHLORAPREP W/TINT 26 (MISCELLANEOUS) ×3 IMPLANT
COMP FEMORAL CRUC LEFT 67.5MM (Joint) ×3 IMPLANT
COMPONENT FEMRL CRUC LT 67.5MM (Joint) IMPLANT
COOLER POLAR GLACIER W/PUMP (MISCELLANEOUS) ×3 IMPLANT
COVER MAYO STAND REUSABLE (DRAPES) ×3 IMPLANT
COVER WAND RF STERILE (DRAPES) ×3 IMPLANT
CUFF TOURN SGL QUICK 24 (TOURNIQUET CUFF) ×2
CUFF TOURN SGL QUICK 30 (TOURNIQUET CUFF)
CUFF TRNQT CYL 24X4X16.5-23 (TOURNIQUET CUFF) IMPLANT
CUFF TRNQT CYL 30X4X21-28X (TOURNIQUET CUFF) IMPLANT
DRAPE 3/4 80X56 (DRAPES) ×3 IMPLANT
DRAPE SPLIT 6X30 W/TAPE (DRAPES) ×3 IMPLANT
DRILL QUICK RELEASE 1/8 INCH (DRILL) ×2
DRSG OPSITE POSTOP 4X10 (GAUZE/BANDAGES/DRESSINGS) ×3 IMPLANT
DRSG OPSITE POSTOP 4X8 (GAUZE/BANDAGES/DRESSINGS) ×3 IMPLANT
ELECT CAUTERY BLADE 6.4 (BLADE) ×3 IMPLANT
ELECT REM PT RETURN 9FT ADLT (ELECTROSURGICAL) ×3
ELECTRODE REM PT RTRN 9FT ADLT (ELECTROSURGICAL) ×1 IMPLANT
GLOVE BIO SURGEON STRL SZ7.5 (GLOVE) ×12 IMPLANT
GLOVE BIO SURGEON STRL SZ8 (GLOVE) ×12 IMPLANT
GLOVE BIOGEL PI IND STRL 8 (GLOVE) ×1 IMPLANT
GLOVE BIOGEL PI INDICATOR 8 (GLOVE) ×2
GLOVE INDICATOR 8.0 STRL GRN (GLOVE) ×3 IMPLANT
GOWN STRL REUS W/ TWL LRG LVL3 (GOWN DISPOSABLE) ×1 IMPLANT
GOWN STRL REUS W/ TWL XL LVL3 (GOWN DISPOSABLE) ×1 IMPLANT
GOWN STRL REUS W/TWL LRG LVL3 (GOWN DISPOSABLE) ×2
GOWN STRL REUS W/TWL XL LVL3 (GOWN DISPOSABLE) ×2
HOLDER FOLEY CATH W/STRAP (MISCELLANEOUS) ×3 IMPLANT
HOOD PEEL AWAY FLYTE STAYCOOL (MISCELLANEOUS) ×9 IMPLANT
INSERT TIB BEARING 71X12 (Insert) ×2 IMPLANT
KIT TURNOVER KIT A (KITS) ×1 IMPLANT
NDL SAFETY ECLIPSE 18X1.5 (NEEDLE) ×2 IMPLANT
NDL SPNL 20GX3.5 QUINCKE YW (NEEDLE) ×1 IMPLANT
NEEDLE HYPO 18GX1.5 SHARP (NEEDLE) ×4
NEEDLE SPNL 20GX3.5 QUINCKE YW (NEEDLE) ×3 IMPLANT
NS IRRIG 1000ML POUR BTL (IV SOLUTION) ×3 IMPLANT
PACK TOTAL KNEE (MISCELLANEOUS) ×3 IMPLANT
PAD WRAPON POLAR KNEE (MISCELLANEOUS) ×1 IMPLANT
PATELLA STD 34X8.5 (Orthopedic Implant) ×2 IMPLANT
PLATE KNEE TIBIAL 71MM FIXED (Plate) ×2 IMPLANT
PULSAVAC PLUS IRRIG FAN TIP (DISPOSABLE) ×3
SOL .9 NS 3000ML IRR  AL (IV SOLUTION) ×2
SOL .9 NS 3000ML IRR UROMATIC (IV SOLUTION) ×1 IMPLANT
STAPLER SKIN PROX 35W (STAPLE) ×3 IMPLANT
SUCTION FRAZIER HANDLE 10FR (MISCELLANEOUS) ×2
SUCTION TUBE FRAZIER 10FR DISP (MISCELLANEOUS) ×1 IMPLANT
SUT VIC AB 0 CT1 36 (SUTURE) ×9 IMPLANT
SUT VIC AB 2-0 CT1 27 (SUTURE) ×6
SUT VIC AB 2-0 CT1 TAPERPNT 27 (SUTURE) ×3 IMPLANT
SYR 10ML LL (SYRINGE) ×3 IMPLANT
SYR 20ML LL LF (SYRINGE) ×3 IMPLANT
SYR 30ML LL (SYRINGE) ×9 IMPLANT
TIP FAN IRRIG PULSAVAC PLUS (DISPOSABLE) ×1 IMPLANT
TRAY FOLEY MTR SLVR 16FR STAT (SET/KITS/TRAYS/PACK) ×3 IMPLANT
WRAPON POLAR PAD KNEE (MISCELLANEOUS) ×3

## 2019-08-10 NOTE — Progress Notes (Signed)
D: Pt alert and oriented. Pt experience N/V and has received meds per orders to assist w/ N/V.   A: Scheduled medications administered to pt, per MD orders. Support and encouragement provided.   R: No adverse drug reactions noted. Pt complaint with medications and treatment plan. Pt interacts well with staff on the unit. Pt is stable, will continue to monitor and provide care as ordered.

## 2019-08-10 NOTE — Anesthesia Post-op Follow-up Note (Signed)
Anesthesia QCDR form completed.        

## 2019-08-10 NOTE — Op Note (Signed)
08/10/2019  10:19 AM  Patient:   Jodi Goodman  Pre-Op Diagnosis:   Degenerative joint disease, left knee.  Post-Op Diagnosis:   Same  Procedure:   Left TKA using all-cemented Biomet Vanguard system with a 67.5 mm mm PCR femur, a 71 mm tibial tray with a 12 mm anterior stabilized E-poly insert, and a 34 x 8.5 mm all-poly 3-pegged domed patella.  Surgeon:   Pascal Lux, MD  Assistant:   Cameron Proud, PA-C; Janit Bern, PA-S  Anesthesia:   Spinal  Findings:   As above  Complications:   None  EBL:   10 cc  Fluids:   1000 cc crystalloid  UOP:   650 cc  TT:   100 minutes at 300 mmHg  Drains:   None  Closure:   Staples  Implants:   As above  Brief Clinical Note:   The patient is a 68 year old female with a long history of progressively worsening left knee pain. The patient's symptoms have progressed despite medications, activity modification, injections, etc. The patient's history and examination were consistent with advanced degenerative joint disease of the right knee confirmed by plain radiographs. The patient presents at this time for a left total knee arthroplasty.  Procedure:   The patient was brought into the operating room. After adequate spinal anesthesia was obtained, the patient was lain in the supine position. A Foley catheter was placed by the nurse before the right lower extremity was prepped with ChloraPrep solution and draped sterilely. Preoperative antibiotics were administered. After verifying the proper laterality with a surgical timeout, the limb was exsanguinated with an Esmarch and the tourniquet inflated to 300 mmHg. A standard anterior approach to the knee was made through an approximately 7 inch incision. The incision was carried down through the subcutaneous tissues to expose superficial retinaculum. This was split the length of the incision and the medial flap elevated sufficiently to expose the medial retinaculum. The medial retinaculum was incised,  leaving a 3-4 mm cuff of tissue on the patella. This was extended distally along the medial border of the patellar tendon and proximally through the medial third of the quadriceps tendon. A subtotal fat pad excision was performed before the soft tissues were elevated off the anteromedial and anterolateral aspects of the proximal tibia to the level of the collateral ligaments. The anterior portions of the medial and lateral menisci were removed, as was the anterior cruciate ligament. With the knee flexed to 90, the external tibial guide was positioned and the appropriate proximal tibial cut made. This piece was taken to the back table where it was measured and found to be optimally replicated by a 71 mm component.  Attention was directed to the distal femur. The intramedullary canal was accessed through a 3/8" drill hole. The intramedullary guide was inserted and positioned in order to obtain a neutral flexion gap. The intercondylar block was positioned with care taken to avoid notching the anterior cortex of the femur. The appropriate cut was made. Next, the distal cutting block was placed at 6 of valgus alignment. Using the 9 mm slot, the distal cut was made. The distal femur was measured and found to be optimally replicated by the 16.0 mm component. The 67.5 mm 4-in-1 cutting block was positioned and first the posterior, then the posterior chamfer, the anterior chamfer, and finally the anterior cuts were made. At this point, the posterior portions medial and lateral menisci were removed. A trial reduction was performed using the appropriate femoral and tibial  components with first the 10 mm and then the 12 mm insert. The 12 mm insert demonstrated excellent stability to varus and valgus stressing both in flexion and extension while permitting full extension. Patella tracking was assessed and found to be excellent. Therefore, the tibial guide position was marked on the proximal tibia. The patella thickness was  measured and found to be 21 mm. Therefore, the appropriate cut was made. The patellar surface was measured and found to be optimally replicated by the 34 mm component. The three peg holes were drilled in place before the trial button was inserted. Patella tracking was assessed and found to be excellent, passing the "no thumb test". The lug holes were drilled into the distal femur before the trial component was removed, leaving only the tibial tray. The keel was then created using the appropriate tower, reamer, and punch.  The bony surfaces were prepared for cementing by irrigating them thoroughly with bacitracin saline solution via the jet lavage system. A bone plug was fashioned from some of the bone that had been removed previously and used to plug the distal femoral canal. In addition, 20 cc of Exparel diluted out to 60 cc with normal saline and 30 cc of 0.5% Sensorcaine were injected into the postero-medial and postero-lateral aspects of the knee, the medial and lateral gutter regions, and the peri-incisional tissues to help with postoperative analgesia. Meanwhile, the cement was being mixed on the back table. When it was ready, the tibial tray was cemented in first. The excess cement was removed using Personal assistant. Next, the femoral component was impacted into place. Again, the excess cement was removed using Personal assistant. The 12 mm trial insert was positioned and the knee brought into extension while the cement hardened. Finally, the patella was cemented into place and secured using the patellar clamp. Again, the excess cement was removed using Personal assistant. Once the cement had hardened, the knee was placed through a range of motion with the findings as described above. Therefore, the trial insert was removed and, after verifying that no cement had been retained posteriorly, the permanent 12 mm anterior stabilized E-polyethylene insert was positioned and secured using the appropriate key locking  mechanism. Again the knee was placed through a range of motion with the findings as described above.  The wound was copiously irrigated with sterile saline solution using the jet lavage system before the quadriceps tendon and retinacular layer were reapproximated using #0 Vicryl interrupted sutures. The superficial retinacular layer also was closed using a running #0 Vicryl suture. A total of 10 cc of transexemic acid (TXA) was injected intra-articularly before the subcutaneous tissues were closed in several layers using 2-0 Vicryl interrupted sutures. The skin was closed using staples. A sterile honeycomb dressing was applied to the skin before the leg was wrapped with an Ace wrap to accommodate the Polar Care device. The patient was then awakened and returned to the recovery room in satisfactory condition after tolerating the procedure well.

## 2019-08-10 NOTE — H&P (Signed)
Paper H&P to be scanned into permanent record. H&P reviewed and patient re-examined. No changes. 

## 2019-08-10 NOTE — Anesthesia Preprocedure Evaluation (Signed)
Anesthesia Evaluation  Patient identified by MRN, date of birth, ID band Patient awake    Reviewed: Allergy & Precautions, NPO status , Patient's Chart, lab work & pertinent test results  History of Anesthesia Complications Negative for: history of anesthetic complications  Airway Mallampati: II  TM Distance: >3 FB Neck ROM: Full    Dental  (+) Edentulous Upper, Edentulous Lower   Pulmonary neg pulmonary ROS, neg sleep apnea, neg COPD,    breath sounds clear to auscultation- rhonchi (-) wheezing      Cardiovascular hypertension, Pt. on medications (-) CAD, (-) Past MI, (-) Cardiac Stents and (-) CABG  Rhythm:Regular Rate:Normal - Systolic murmurs and - Diastolic murmurs    Neuro/Psych neg Seizures PSYCHIATRIC DISORDERS Anxiety negative neurological ROS     GI/Hepatic Neg liver ROS, GERD  ,  Endo/Other  negative endocrine ROS  Renal/GU negative Renal ROS     Musculoskeletal  (+) Arthritis ,   Abdominal (+) - obese,   Peds  Hematology  (+) anemia ,   Anesthesia Other Findings Past Medical History: No date: Anemia     Comment:  H/O No date: Anxiety No date: Arthritis No date: Bruises easily No date: Dysrhythmia     Comment:  PALPITATIONS No date: GERD (gastroesophageal reflux disease) No date: Hyperlipidemia No date: Hypertension   Reproductive/Obstetrics                             Lab Results  Component Value Date   WBC 6.4 07/30/2019   HGB 12.9 08/10/2019   HCT 38.0 08/10/2019   MCV 91.5 07/30/2019   PLT 292 07/30/2019    Anesthesia Physical Anesthesia Plan  ASA: II  Anesthesia Plan: Spinal   Post-op Pain Management:    Induction:   PONV Risk Score and Plan: 2 and Propofol infusion  Airway Management Planned: Natural Airway  Additional Equipment:   Intra-op Plan:   Post-operative Plan:   Informed Consent: I have reviewed the patients History and Physical,  chart, labs and discussed the procedure including the risks, benefits and alternatives for the proposed anesthesia with the patient or authorized representative who has indicated his/her understanding and acceptance.     Dental advisory given  Plan Discussed with: CRNA and Anesthesiologist  Anesthesia Plan Comments:         Anesthesia Quick Evaluation

## 2019-08-10 NOTE — Evaluation (Signed)
Physical Therapy Evaluation Patient Details Name: Jodi Goodman MRN: [id number] DOB: 1951-03-21 Today's Date: 08/10/2019   History of Present Illness  Pt is a 68 yo female diagnosed with DJD of the L knee and is s/p elective L TKA.  PMH includes: HTN, GERD, and anxiety.    Clinical Impression  Pt presented with deficits in strength, transfers, mobility, gait, balance, L knee ROM, and activity tolerance.  Pt was motivated to participate during session but after minimal effort with bed mobility tasks began to experience significant N&V.  After vomiting pt reported feeling better and wished to proceed with session. Pt did experience exertional incontinence while vomiting with new gown provided.  Pt was able to stand and take several small steps near the EOB with the plan to make it to the chair but again experienced N&V and urinary incontinence.  Pt returned to supine with nursing called to assist with gown/linen change.  Functionally pt performed well considering POD#0 status and once nausea is under control should progress well while in acute care.  Pt will benefit from HHPT services upon discharge to safely address above deficits for decreased caregiver assistance and eventual return to PLOF.       Follow Up Recommendations Home health PT;Supervision for mobility/OOB    Equipment Recommendations  Rolling walker with 5" wheels;3in1 (PT)    Recommendations for Other Services       Precautions / Restrictions Precautions Precautions: Knee Precaution Booklet Issued: Yes (comment) Restrictions Weight Bearing Restrictions: Yes LLE Weight Bearing: Weight bearing as tolerated      Mobility  Bed Mobility Overal bed mobility: Needs Assistance Bed Mobility: Supine to Sit;Sit to Supine     Supine to sit: Supervision Sit to supine: Min assist   General bed mobility comments: Extra time and effort required during sup to sit and min A for LLE control during sit to  sup  Transfers Overall transfer level: Needs assistance Equipment used: Rolling walker (2 wheeled) Transfers: Sit to/from Stand Sit to Stand: Min guard         General transfer comment: Min verbal cues for general sequencing  Ambulation/Gait Ambulation/Gait assistance: Min guard Gait Distance (Feet): 2 Feet Assistive device: Rolling walker (2 wheeled) Gait Pattern/deviations: Step-to pattern;Decreased stance time - left;Decreased step length - right;Antalgic Gait velocity: decreased   General Gait Details: Mildly antalgic step-to pattern on the L with mod lean on the RW for support but no LOB or L knee buckling noted  Stairs            Wheelchair Mobility    Modified Rankin (Stroke Patients Only)       Balance Overall balance assessment: Needs assistance Sitting-balance support: Single extremity supported;Feet supported Sitting balance-Leahy Scale: Good     Standing balance support: Bilateral upper extremity supported Standing balance-Leahy Scale: Fair Standing balance comment: Mod lean on the RW for support                             Pertinent Vitals/Pain Pain Assessment: 0-10 Pain Score: 8  Pain Location: L knee Pain Descriptors / Indicators: Aching;Sore Pain Intervention(s): Premedicated before session;Monitored during session    [place] expects to be discharged to:: Private residence Living Arrangements: Alone Available Help at Discharge: Family;Available 24 hours/day Type of Home: House Home Access: Stairs to enter Entrance Stairs-Rails: Right;Left;Can reach both Entrance Stairs-Number of Steps: 4 Home Layout: One level Home Equipment: None  Prior Function Level of Independence: Independent         Comments: Pt Ind with amb community distances without an AD, Ind with ADLs, no fall history     Hand Dominance        Extremity/Trunk Assessment   Upper Extremity Assessment Upper Extremity  Assessment: Overall WFL for tasks assessed    Lower Extremity Assessment Lower Extremity Assessment: Generalized weakness;LLE deficits/detail LLE Deficits / Details: LLE hip flex >/= 3/5 LLE: Unable to fully assess due to pain LLE Sensation: WNL LLE Coordination: WNL       Communication   Communication: No difficulties  Cognition Arousal/Alertness: Awake/alert Behavior During Therapy: WFL for tasks assessed/performed Overall Cognitive Status: Within Functional Limits for tasks assessed                                        General Comments      Exercises Total Joint Exercises Ankle Circles/Pumps: AROM;Both;10 reps;Strengthening Quad Sets: AROM;Strengthening;Left;10 reps;5 reps Hip ABduction/ADduction: AROM;AAROM;Both;5 reps Straight Leg Raises: AROM;AAROM;Both;5 reps Long Arc Quad: AROM;Strengthening;Left;5 reps;10 reps Knee Flexion: AROM;Strengthening;Left;5 reps;10 reps Goniometric ROM: L knee AROM: 6-90 deg Marching in Standing: AROM;Both;5 reps;Standing Other Exercises Other Exercises: HEP education for LLE QS and knee flex per handout   Assessment/Plan    PT Assessment Patient needs continued PT services  PT Problem List Decreased strength;Decreased range of motion;Decreased activity tolerance;Decreased balance;Decreased mobility;Decreased knowledge of use of DME       PT Treatment Interventions DME instruction;Gait training;Stair training;Functional mobility training;Therapeutic activities;Therapeutic exercise;Balance training;Patient/family education    PT Goals (Current goals can be found in the Care Plan section)  Acute Rehab PT Goals Patient Stated Goal: To walk better and learn how to do stairs PT Goal Formulation: With patient Time For Goal Achievement: 08/23/19 Potential to Achieve Goals: Good    Frequency BID   Barriers to discharge        Co-evaluation               AM-PAC PT "6 Clicks" Mobility  Outcome Measure Help  needed turning from your back to your side while in a flat bed without using bedrails?: A Little Help needed moving from lying on your back to sitting on the side of a flat bed without using bedrails?: A Little Help needed moving to and from a bed to a chair (including a wheelchair)?: A Little Help needed standing up from a chair using your arms (e.g., wheelchair or bedside chair)?: A Little Help needed to walk in hospital room?: A Lot Help needed climbing 3-5 steps with a railing? : A Lot 6 Click Score: 16    End of Session Equipment Utilized During Treatment: Gait belt Activity Tolerance: No increased pain;Other (comment)(Pt limited by nausea) Patient left: in bed;with call bell/phone within reach;with bed alarm set;with family/visitor present;Other (comment)(Polar care and SCDs not applied secondary to bed sheets wet and needing changed) Nurse Communication: Mobility status;Other (comment)(Pt with N&V, incontinent of urine during session, in need of bed linen change) PT Visit Diagnosis: Other abnormalities of gait and mobility (R26.89);Muscle weakness (generalized) (M62.81)    Time: 6440-3474 PT Time Calculation (min) (ACUTE ONLY): 51 min   Charges:   PT Evaluation $PT Eval Moderate Complexity: 1 Mod PT Treatments $Therapeutic Exercise: 8-22 mins        D. Elly Modena PT, DPT 08/10/19, 5:13 PM

## 2019-08-10 NOTE — TOC Benefit Eligibility Note (Signed)
Transition of Care [facility]) Benefit Eligibility Note    Patient Details  Name: Jodi Goodman MRN: [id number] Date of Birth: 1951-04-08   Medication/Dose: Enoxaparin 40mg  once daily for 14 days  Covered?: Yes  Prescription Coverage Preferred Pharmacy: [facility] or [facility] both preferrred.  Spoke with Person/Company/Phone Number:: Neoma Laming with [employer] at [phone]  Co-Pay: $166.40 estimated copay  Prior Approval: No  Deductible: Unmet($200 deductible, unmet as of time of call.)   Dannette Barbara Phone Number: [phone] or [phone] 08/10/2019, 9:03 AM

## 2019-08-10 NOTE — Transfer of Care (Signed)
Immediate Anesthesia Transfer of Care Note  Patient: Jodi Goodman  Procedure(s) Performed: TOTAL KNEE ARTHROPLASTY (Left Knee)  Patient Location: PACU  Anesthesia Type:Spinal  Level of Consciousness: awake, alert  and oriented  Airway & Oxygen Therapy: Patient Spontanous Breathing and Patient connected to nasal cannula oxygen  Post-op Assessment: Report given to RN and Post -op Vital signs reviewed and stable  Post vital signs: Reviewed and stable  Last Vitals:  Vitals Value Taken Time  BP    Temp    Pulse 74 08/10/19 1017  Resp 14 08/10/19 1017  SpO2 100 % 08/10/19 1017  Vitals shown include unvalidated device data.  Last Pain:  Vitals:   08/10/19 0626  TempSrc: Temporal  PainSc: 7          Complications: No apparent anesthesia complications

## 2019-08-10 NOTE — TOC Progression Note (Signed)
Transition of Care [facility]) - Progression Note    Patient Details  Name: Jodi Goodman MRN: [id number] Date of Birth: Mar 20, 1951  Transition of [facility]) CM/SW [place], RN Phone Number: 08/10/2019, 8:53 AM  Clinical Narrative:     Requested the price of Lovenox       Expected Discharge Plan and Services                                                 Social Determinants of Health (SDOH) Interventions    Readmission Risk Interventions No flowsheet data found.

## 2019-08-10 NOTE — Progress Notes (Signed)
Called pt's daugher, Fayette Pho, twice to notify pt to room 146. No answer, unidentified voice mail. Did not leave message. Per Marisue Brooklyn, SDS check in, daughter was given pt's room number.

## 2019-08-10 NOTE — Anesthesia Procedure Notes (Signed)
Spinal  Patient location during procedure: OR Start time: 08/10/2019 7:45 AM End time: 08/10/2019 7:48 AM Staffing Anesthesiologist: Emmie Niemann, MD Resident/CRNA: Bernardo Heater, CRNA Performed: resident/CRNA  Preanesthetic Checklist Completed: patient identified, site marked, surgical consent, pre-op evaluation, timeout performed, IV checked, risks and benefits discussed and monitors and equipment checked Spinal Block Patient position: sitting Prep: ChloraPrep Patient monitoring: heart rate, continuous pulse ox and blood pressure Approach: midline Location: L4-5 Injection technique: single-shot Needle Needle type: Introducer and Pencil-Tip  Needle gauge: 24 G Needle length: 9 cm Additional Notes Negative paresthesia. Negative blood return. Positive free-flowing CSF. Expiration date of kit checked and confirmed. Patient tolerated procedure well, without complications.

## 2019-08-11 ENCOUNTER — Encounter: Payer: Self-pay | Admitting: Surgery

## 2019-08-11 LAB — BASIC METABOLIC PANEL
Anion gap: 8 (ref 5–15)
BUN: 10 mg/dL (ref 8–23)
CO2: 23 mmol/L (ref 22–32)
Calcium: 8.9 mg/dL (ref 8.9–10.3)
Chloride: 110 mmol/L (ref 98–111)
Creatinine, Ser: 0.74 mg/dL (ref 0.44–1.00)
GFR calc Af Amer: >60 mL/min (ref >60)
GFR calc non Af Amer: >60 mL/min (ref >60)
Glucose, Bld: 139 mg/dL — ABNORMAL HIGH (ref 70–99)
Potassium: 3.2 mmol/L — ABNORMAL LOW (ref 3.5–5.1)
Sodium: 141 mmol/L (ref 135–145)

## 2019-08-11 LAB — CBC WITH DIFFERENTIAL/PLATELET
Abs Immature Granulocytes: 0.03 10*3/uL (ref 0.00–0.07)
Basophils Absolute: 0 10*3/uL (ref 0.0–0.1)
Basophils Relative: 0 %
Eosinophils Absolute: 0.1 10*3/uL (ref 0.0–0.5)
Eosinophils Relative: 1 %
HCT: 30.7 % — ABNORMAL LOW (ref 36.0–46.0)
Hemoglobin: 10.2 g/dL — ABNORMAL LOW (ref 12.0–15.0)
Immature Granulocytes: 0 %
Lymphocytes Relative: 8 %
Lymphs Abs: 0.9 10*3/uL (ref 0.7–4.0)
MCH: 30.4 pg (ref 26.0–34.0)
MCHC: 33.2 g/dL (ref 30.0–36.0)
MCV: 91.6 fL (ref 80.0–100.0)
Monocytes Absolute: 0.7 10*3/uL (ref 0.1–1.0)
Monocytes Relative: 7 %
Neutro Abs: 8.7 10*3/uL — ABNORMAL HIGH (ref 1.7–7.7)
Neutrophils Relative %: 84 %
Platelets: 217 10*3/uL (ref 150–400)
RBC: 3.35 MIL/uL — ABNORMAL LOW (ref 3.87–5.11)
RDW: 12.9 % (ref 11.5–15.5)
WBC: 10.5 10*3/uL (ref 4.0–10.5)
nRBC: 0 % (ref 0.0–0.2)

## 2019-08-11 MED ORDER — POTASSIUM CHLORIDE 20 MEQ PO PACK
40.0000 meq | PACK | Freq: Once | ORAL | Status: AC
  Administered 2019-08-11: 10:00:00 40 meq via ORAL
  Filled 2019-08-11: qty 2

## 2019-08-11 NOTE — Progress Notes (Signed)
Physical Therapy Treatment Patient Details Name: Jodi Goodman MRN: [id number] DOB: Apr 25, 1951 Today's Date: 08/11/2019    History of Present Illness Pt is a 68 yo female diagnosed with DJD of the L knee and is s/p elective L TKA.  PMH includes: HTN, GERD, and anxiety.    PT Comments    Pt presented with deficits in strength, transfers, mobility, gait, balance, activity tolerance, and L knee ROM. Pt's pain was well-controlled, a 4/10 initially, and decreased with functional activity. Pt completed three sit<>stand transfers with steadily improving movement pattern. Pt completed 80-foot walk with good control of the RW a min cuing required. Pt will benefit from HHPT services upon discharge to safely address above deficits for decreased caregiver assistance and eventual return to PLOF.    Follow Up Recommendations  Home health PT;Supervision for mobility/OOB     Equipment Recommendations  Rolling walker with 5" wheels;3in1 (PT)    Recommendations for Other Services       Precautions / Restrictions Precautions Precautions: Knee Precaution Booklet Issued: Yes (comment) Restrictions Weight Bearing Restrictions: Yes LLE Weight Bearing: Weight bearing as tolerated    Mobility  Bed Mobility Overal bed mobility: Needs Assistance Bed Mobility: Supine to Sit;Sit to Supine     Supine to sit: Supervision Sit to supine: Supervision   General bed mobility comments: Extra time and effort required during sup to sit and min verbal cuing for coming to EOB  Transfers Overall transfer level: Needs assistance Equipment used: Rolling walker (2 wheeled) Transfers: Sit to/from Stand Sit to Stand: Min guard         General transfer comment: Min verbal cues for general sequencing  Ambulation/Gait Ambulation/Gait assistance: Min guard Gait Distance (Feet): 80 Feet Assistive device: Rolling walker (2 wheeled) Gait Pattern/deviations: Step-to pattern;Decreased step length -  right;Antalgic;Decreased step length - left Gait velocity: Decreased   General Gait Details: Slow step-to pattern on the L with mod lean on the RW for support but no LOB or L knee buckling noted   Stairs             Wheelchair Mobility    Modified Rankin (Stroke Patients Only)       Balance Overall balance assessment: Needs assistance Sitting-balance support: Single extremity supported;Feet unsupported Sitting balance-Leahy Scale: Good     Standing balance support: Bilateral upper extremity supported;During functional activity Standing balance-Leahy Scale: Fair Standing balance comment: Mod lean on the RW for support                            Cognition Arousal/Alertness: Awake/alert Behavior During Therapy: WFL for tasks assessed/performed Overall Cognitive Status: Within Functional Limits for tasks assessed                                        Exercises Total Joint Exercises Ankle Circles/Pumps: AROM;Both;10 reps;Strengthening Quad Sets: AROM;Strengthening;Left;10 reps;15 reps Gluteal Sets: Strengthening;Both;10 reps Heel Slides: AROM;Strengthening;Both;5 reps;10 reps Hip ABduction/ADduction: AROM;AAROM;Both;10 reps;15 reps Straight Leg Raises: Strengthening;AAROM;Left;10 reps;AROM;Right;5 reps Long Arc Quad: AROM;Strengthening;Both;10 reps;15 reps Knee Flexion: AROM;Strengthening;Both;10 reps;15 reps Marching in Standing: AROM;Standing;Both;10 reps Other Exercises Other Exercises: Reviewed HEP and used pt teach-back method.    General Comments        Pertinent Vitals/Pain Pain Score: 4  Pain Location: L knee Pain Descriptors / Indicators: Aching;Sore Pain Intervention(s): Limited activity within patient's tolerance;Monitored during session  Home Living                      Prior Function            PT Goals (current goals can now be found in the care plan section) Progress towards PT goals: Progressing  toward goals    Frequency    BID      PT Plan Current plan remains appropriate    Co-evaluation              AM-PAC PT "6 Clicks" Mobility   Outcome Measure  Help needed turning from your back to your side while in a flat bed without using bedrails?: None Help needed moving from lying on your back to sitting on the side of a flat bed without using bedrails?: A Little Help needed moving to and from a bed to a chair (including a wheelchair)?: A Little Help needed standing up from a chair using your arms (e.g., wheelchair or bedside chair)?: A Little Help needed to walk in hospital room?: A Little Help needed climbing 3-5 steps with a railing? : A Lot 6 Click Score: 18    End of Session Equipment Utilized During Treatment: Gait belt Activity Tolerance: Patient limited by fatigue Patient left: in bed;with call bell/phone within reach;with bed alarm set;with family/visitor present;with SCD's reapplied;Other (comment)(L knee Polar Care donned.) Nurse Communication: Mobility status PT Visit Diagnosis: Other abnormalities of gait and mobility (R26.89);Muscle weakness (generalized) (M62.81)     Time: 4332-9518 PT Time Calculation (min) (ACUTE ONLY): 42 min  Charges:                        [name], SPT  08/11/19, 4:19 PM

## 2019-08-11 NOTE — Progress Notes (Signed)
Physical Therapy Treatment Patient Details Name: Jodi Goodman MRN: [id number] DOB: 10-19-1951 Today's Date: 08/11/2019    History of Present Illness Pt is a 68 yo female diagnosed with DJD of the L knee and is s/p elective L TKA.  PMH includes: HTN, GERD, and anxiety.    PT Comments    Pt presented with deficits in strength, transfers, mobility, gait, balance, activity tolerance, and L knee AROM. Pt was no longer nauseous with activity, and pain was well-controlled, 5/10 throughout, though her L knee AROM had decreased since yesterday (6-90 degrees) and was now 13-73 degrees and limited by pain. Pt tolerated bed exercises well, good eccentric and concentric control on sit<>stand transfer, and a 40-foot walk with min cuing to manage RW during cornering. Pt will benefit from HHPT services upon discharge to safely address above deficits for decreased caregiver assistance and eventual return to PLOF.    Follow Up Recommendations  Home health PT;Supervision for mobility/OOB     Equipment Recommendations  Rolling walker with 5" wheels;3in1 (PT)    Recommendations for Other Services       Precautions / Restrictions Precautions Precautions: Knee Precaution Booklet Issued: Yes (comment) Restrictions Weight Bearing Restrictions: Yes LLE Weight Bearing: Weight bearing as tolerated    Mobility  Bed Mobility Overal bed mobility: Needs Assistance Bed Mobility: Supine to Sit;Sit to Supine     Supine to sit: Supervision Sit to supine: Supervision   General bed mobility comments: Extra time and effort required during sup to sit and min verbal cuing for coming to EOB  Transfers Overall transfer level: Needs assistance Equipment used: Rolling walker (2 wheeled) Transfers: Sit to/from Stand Sit to Stand: Min guard         General transfer comment: Min verbal cues for general sequencing  Ambulation/Gait Ambulation/Gait assistance: Min guard Gait Distance (Feet): 40  Feet Assistive device: Rolling walker (2 wheeled) Gait Pattern/deviations: Step-to pattern;Decreased step length - right;Antalgic;Decreased step length - left Gait velocity: Decreased   General Gait Details: Mildly antalgic step-to pattern on the L with mod lean on the RW for support but no LOB or L knee buckling noted   Stairs             Wheelchair Mobility    Modified Rankin (Stroke Patients Only)       Balance Overall balance assessment: Needs assistance Sitting-balance support: Single extremity supported;Feet unsupported Sitting balance-Leahy Scale: Good     Standing balance support: Bilateral upper extremity supported;During functional activity Standing balance-Leahy Scale: Fair Standing balance comment: Mod lean on the RW for support                            Cognition Arousal/Alertness: Awake/alert Behavior During Therapy: WFL for tasks assessed/performed Overall Cognitive Status: Within Functional Limits for tasks assessed                                        Exercises Total Joint Exercises Ankle Circles/Pumps: AROM;Both;10 reps;Strengthening Quad Sets: AROM;Strengthening;Left;10 reps;15 reps Gluteal Sets: Strengthening;Both;10 reps Heel Slides: AROM;Strengthening;Both;5 reps;10 reps Straight Leg Raises: Strengthening;AAROM;Left;10 reps;AROM;Right;5 reps Long Arc Quad: AROM;Strengthening;Both;10 reps;15 reps Knee Flexion: AROM;Strengthening;Both;10 reps;15 reps Goniometric ROM: L knee AROM: 13-73 Marching in Standing: AROM;Both;5 reps;Standing Other Exercises Other Exercises: HEP education for LLE QS and knee flex per handout    General Comments  Pertinent Vitals/Pain Pain Assessment: 0-10 Pain Score: 5  Pain Location: L knee Pain Descriptors / Indicators: Aching;Sore Pain Intervention(s): Limited activity within patient's tolerance;Monitored during session    Home Living                       Prior Function            PT Goals (current goals can now be found in the care plan section) Progress towards PT goals: Progressing toward goals    Frequency    BID      PT Plan Current plan remains appropriate    Co-evaluation              AM-PAC PT "6 Clicks" Mobility   Outcome Measure  Help needed turning from your back to your side while in a flat bed without using bedrails?: None Help needed moving from lying on your back to sitting on the side of a flat bed without using bedrails?: A Little Help needed moving to and from a bed to a chair (including a wheelchair)?: A Little Help needed standing up from a chair using your arms (e.g., wheelchair or bedside chair)?: A Little Help needed to walk in hospital room?: A Little Help needed climbing 3-5 steps with a railing? : A Lot 6 Click Score: 18    End of Session Equipment Utilized During Treatment: Gait belt Activity Tolerance: Patient tolerated treatment well Patient left: in chair;with call bell/phone within reach;with chair alarm set;with family/visitor present;with SCD's reapplied;Other (comment)(L knee Polar Care donned) Nurse Communication: Mobility status PT Visit Diagnosis: Other abnormalities of gait and mobility (R26.89);Muscle weakness (generalized) (M62.81)     Time: 9678-9381 PT Time Calculation (min) (ACUTE ONLY): 43 min  Charges:              [name], SPT  08/11/19, 12:23 PM

## 2019-08-11 NOTE — Progress Notes (Signed)
  Subjective: 1 Day Post-Op Procedure(s) (LRB): TOTAL KNEE ARTHROPLASTY (Left) Patient reports pain as mild.   Patient is well, and has had no acute complaints or problems PT and care management to assist with discharge planning. Negative for chest pain and shortness of breath Fever: Temp 101 yesterday, 99 temp this morning. Gastrointestinal:Negative for nausea and vomiting this AM, did have N/V last night.  Objective: Vital signs in last 24 hours: Temp:  [97.3 F (36.3 C)-101.2 F (38.4 C)] 99 F (37.2 C) (10/21 0552) Pulse Rate:  [56-95] 84 (10/21 0552) Resp:  [13-19] 19 (10/21 0552) BP: (81-122)/(57-83) 110/75 (10/21 0552) SpO2:  [92 %-100 %] 92 % (10/21 0552) Weight:  [65.3 kg] 65.3 kg (10/20 1240)  Intake/Output from previous day:  Intake/Output Summary (Last 24 hours) at 08/11/2019 0737 Last data filed at 08/10/2019 2353 Gross per 24 hour  Intake 1102.17 ml  Output 1660 ml  Net -557.83 ml    Intake/Output this shift: No intake/output data recorded.  Labs: Recent Labs    08/10/19 0629 08/11/19 0437  HGB 12.9 10.2*   Recent Labs    08/10/19 0629 08/11/19 0437  WBC  --  10.5  RBC  --  3.35*  HCT 38.0 30.7*  PLT  --  217   Recent Labs    08/10/19 0629 08/11/19 0437  NA 143 141  K 3.3* 3.2*  CL 104 110  CO2  --  23  BUN 18 10  CREATININE 0.80 0.74  GLUCOSE 91 139*  CALCIUM  --  8.9   No results for input(s): LABPT, INR in the last 72 hours.   EXAM General - Patient is Alert, Appropriate and Oriented Extremity - ABD soft Sensation intact distally Intact pulses distally Dorsiflexion/Plantar flexion intact Incision: scant drainage No cellulitis present Dressing/Incision - blood tinged drainage to the left knee. Motor Function - intact, moving foot and toes well on exam.   Past Medical History:  Diagnosis Date  . Anemia    H/O  . Anxiety   . Arthritis   . Bruises easily   . Dysrhythmia    PALPITATIONS  . GERD (gastroesophageal reflux  disease)   . Hyperlipidemia   . Hypertension     Assessment/Plan: 1 Day Post-Op Procedure(s) (LRB): TOTAL KNEE ARTHROPLASTY (Left) Active Problems:   Status post total knee replacement using cement, left  Estimated body mass index is 24.72 kg/m as calculated from the following:   Height as of this encounter: 5\' 4"  (1.626 m).   Weight as of this encounter: 65.3 kg. Advance diet Up with therapy D/C IV fluids when tolerating po intake.  Labs reviewed this AM, Recent fevers, WBC 10.5 this AM. Denies any SOB or chest pain. N/V improved this AM. Up with therapy today.  Continue to work on BM. Plan for possible d/c home tomorrow pending progress.  DVT Prophylaxis - Lovenox and Foot Pumps Weight-Bearing as tolerated to left leg  J. Cameron Proud, PA-C [facility] Orthopaedic Surgery 08/11/2019, 7:37 AM

## 2019-08-11 NOTE — TOC Initial Note (Addendum)
Transition of Care [facility]) - Initial/Assessment Note    Patient Details  Name: Jodi Goodman MRN: [id number] Date of Birth: 05-04-1951  Transition of [facility]) CM/SW Contact:    Kagen Kunath, Lenice Llamas Phone Number: [phone]  08/11/2019, 1:34 PM  Clinical Narrative: Clinical Social Worker (CSW) met with patient alone at bedside to discuss D/C plan. Patient was alert and oriented X4 and was sitting up in the chair at bedside eating lunch. CSW introduced self and explained role of CSW department. Patient reported that she lives alone in [place] however her daughter Rob Bunting will be staying with her for 2 weeks and is already in town. Patient reported that she needs a rolling walker and a bedside commode. CSW sent [facility] [employer] agency representative an email making him aware of above. CSW explained home health options and provided patient CMS list. Patient reported that she does not have a home health agency preference. Surgeon's office preferred home health agency [facility] is not in network with [employer]. Per [facility] representative they can accept patient. Patient is agreeable to [facility]. CSW made patient aware that her Lovenox price is $166.40, CSW provided good Rx care to patient. Patient reported that she doesn't feel comfortable giving herself the Lovenox injections and stated that Dr. Roland Rack is going to change it to a pill. RN aware of above. CSW will continue to follow and assist as needed.               Expected Discharge Plan: [place] Barriers to Discharge: Continued Medical Work up   Patient Goals and CMS Choice Patient states their goals for this hospitalization and ongoing recovery are:: To go home. CMS Medicare.gov Compare Post Acute Care list provided to:: Patient Choice offered to / list presented to : Patient  Expected Discharge Plan and Services Expected Discharge Plan: [place] In-house  Referral: Clinical Social Work   Post Acute Care Choice: [place] arrangements for the past 2 months: [place]                 DME Arranged: Bedside commode, Walker rolling DME Agency: [employer] Date DME Agency Contacted: 08/11/19 Time DME Agency Contacted: [id number] Representative spoke with at DME Agency: [place]: PT [place]: [place] ([place]) Date [place]: 08/11/19 Time [place]: 1103 Representative spoke with at [place]: Corene Cornea  Prior Living Arrangements/Services Living arrangements for the past 2 months: [place] with:: Self Patient language and need for interpreter reviewed:: No Do you feel safe going back to the place where you live?: Yes      Need for Family Participation in Patient Care: No (Comment) Care giver support system in place?: Yes (comment)   Criminal Activity/Legal Involvement Pertinent to Current Situation/Hospitalization: No - Comment as needed  Activities of Daily Living Home Assistive Devices/Equipment: Eyeglasses, Dentures (specify type)(dentures uppers and lowers) ADL Screening (condition at time of admission) Patient's cognitive ability adequate to safely complete daily activities?: Yes Is the patient deaf or have difficulty hearing?: No Does the patient have difficulty seeing, even when wearing glasses/contacts?: No Does the patient have difficulty concentrating, remembering, or making decisions?: No Patient able to express need for assistance with ADLs?: Yes Does the patient have difficulty dressing or bathing?: No Independently performs ADLs?: Yes (appropriate for developmental age) Does the patient have difficulty walking or climbing stairs?: Yes Weakness of Legs: Left Weakness of  Arms/Hands: None  Permission Sought/Granted Permission sought to share information with : Other (comment)(Home Health agency and DME agency) Permission granted to share information with  : Yes, Verbal Permission Granted              Emotional Assessment Appearance:: Appears stated age Attitude/Demeanor/Rapport: Engaged Affect (typically observed): Calm, Pleasant Orientation: : Oriented to Self, Oriented to Place, Oriented to  Time, Oriented to Situation Alcohol / Substance Use: Not Applicable Psych Involvement: No (comment)  Admission diagnosis:  PRIMARY OSTEOARTHRITIS OF LEFT KNEE Patient Active Problem List   Diagnosis Date Noted  . 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 Chronic pain of left knee 02/11/2019  . Chronic pain syndrome 02/11/2019  . Primary osteoarthritis of left knee 05/08/2015   PCP:  Jodi Marble, MD Pharmacy:   [facility] [address]), [place] [address]) [place] [address] Phone: [phone] Fax: [phone]     Social Determinants of Health (SDOH) Interventions    Readmission Risk Interventions No flowsheet data found.

## 2019-08-11 NOTE — Anesthesia Postprocedure Evaluation (Signed)
Anesthesia Post Note  Patient: Jodi Goodman  Procedure(s) Performed: TOTAL KNEE ARTHROPLASTY (Left Knee)  Patient location during evaluation: Nursing Unit Anesthesia Type: Spinal Level of consciousness: oriented and awake and alert Pain management: pain level controlled Vital Signs Assessment: post-procedure vital signs reviewed and stable Respiratory status: spontaneous breathing and respiratory function stable Cardiovascular status: blood pressure returned to baseline and stable Postop Assessment: no headache, no backache, no apparent nausea or vomiting and patient able to bend at knees Anesthetic complications: no     Last Vitals:  Vitals:   08/11/19 0552 08/11/19 0824  BP: 110/75 113/75  Pulse: 84 84  Resp: 19 16  Temp: 37.2 C 37.1 C  SpO2: 92% 97%    Last Pain:  Vitals:   08/11/19 0826  TempSrc:   PainSc: 7                  Caryl Asp

## 2019-08-12 LAB — BASIC METABOLIC PANEL
Anion gap: 9 (ref 5–15)
BUN: 13 mg/dL (ref 8–23)
CO2: 25 mmol/L (ref 22–32)
Calcium: 9.3 mg/dL (ref 8.9–10.3)
Chloride: 106 mmol/L (ref 98–111)
Creatinine, Ser: 0.77 mg/dL (ref 0.44–1.00)
GFR calc Af Amer: >60 mL/min (ref >60)
GFR calc non Af Amer: >60 mL/min (ref >60)
Glucose, Bld: 129 mg/dL — ABNORMAL HIGH (ref 70–99)
Potassium: 3.8 mmol/L (ref 3.5–5.1)
Sodium: 140 mmol/L (ref 135–145)

## 2019-08-12 LAB — CBC
HCT: 31.8 % — ABNORMAL LOW (ref 36.0–46.0)
Hemoglobin: 10.5 g/dL — ABNORMAL LOW (ref 12.0–15.0)
MCH: 30.3 pg (ref 26.0–34.0)
MCHC: 33 g/dL (ref 30.0–36.0)
MCV: 91.9 fL (ref 80.0–100.0)
Platelets: 202 10*3/uL (ref 150–400)
RBC: 3.46 MIL/uL — ABNORMAL LOW (ref 3.87–5.11)
RDW: 13.2 % (ref 11.5–15.5)
WBC: 10.9 10*3/uL — ABNORMAL HIGH (ref 4.0–10.5)
nRBC: 0 % (ref 0.0–0.2)

## 2019-08-12 MED ORDER — OXYCODONE HCL 5 MG PO TABS: 5.0000 mg | ORAL_TABLET | ORAL | 0 refills | Status: DC | PRN

## 2019-08-12 MED ORDER — TRAMADOL HCL 50 MG PO TABS: 50.0000 mg | ORAL_TABLET | Freq: Four times a day (QID) | ORAL | 0 refills | Status: DC | PRN

## 2019-08-12 MED ORDER — APIXABAN 2.5 MG PO TABS: 2.5000 mg | ORAL_TABLET | Freq: Two times a day (BID) | ORAL | 0 refills | Status: DC

## 2019-08-12 MED ORDER — ONDANSETRON HCL 4 MG PO TABS: 4.0000 mg | ORAL_TABLET | Freq: Four times a day (QID) | ORAL | 0 refills | Status: DC | PRN

## 2019-08-12 NOTE — Progress Notes (Signed)
Physical Therapy Treatment Patient Details Name: Jodi Goodman MRN: [id number] DOB: Sep 09, 1951 Today's Date: 08/12/2019    History of Present Illness Pt is a 68 yo female diagnosed with DJD of the L knee and is s/p elective L TKA.  PMH includes: HTN, GERD, and anxiety.    PT Comments    Pt presented with deficits in strength, transfers, mobility, gait, balance, and activity tolerance. Pt was a little fatigued from morning session and her baseline pain was 6/10, which steadily increase to 8/10 by end of session. Pt complete a 200-foot walk, and her pace steadily increased and gait improved to a step-through pattern with min-to-mod cuing. Pt completed sit<>stand transfers from bed, to & from commode, and to chair with good concentric and eccentric control.Pt will benefit from HHPT services upon discharge to safely address above deficits for decreased caregiver assistance and eventual return to PLOF.     Follow Up Recommendations  Home health PT;Supervision for mobility/OOB     Equipment Recommendations  Rolling walker with 5" wheels;3in1 (PT)    Recommendations for Other Services       Precautions / Restrictions Precautions Precautions: Knee Precaution Booklet Issued: Yes (comment) Restrictions Weight Bearing Restrictions: Yes LLE Weight Bearing: Weight bearing as tolerated    Mobility  Bed Mobility Overal bed mobility: Needs Assistance Bed Mobility: Supine to Sit;Sit to Supine     Supine to sit: Supervision Sit to supine: Supervision   General bed mobility comments: Pt was required time and effort to sit up, when asked why she was going so slow, pt responded "because it's painful"  Transfers Overall transfer level: Needs assistance Equipment used: Rolling walker (2 wheeled) Transfers: Sit to/from Stand Sit to Stand: Supervision         General transfer comment: Good carry over, min cuing required  Ambulation/Gait Ambulation/Gait assistance: Min guard Gait  Distance (Feet): 200 Feet Assistive device: Rolling walker (2 wheeled) Gait Pattern/deviations: Step-through pattern;Decreased step length - right;Decreased step length - left Gait velocity: Decreased   General Gait Details: Initially used slow step-to pattern, but with min cuing progressed to a slow step-through pattern, with min-to-mod lean on the RW for support and no LOB or L knee buckling noted.   Stairs             Wheelchair Mobility    Modified Rankin (Stroke Patients Only)       Balance Overall balance assessment: Needs assistance Sitting-balance support: Single extremity supported;Feet unsupported Sitting balance-Leahy Scale: Good     Standing balance support: Bilateral upper extremity supported;During functional activity Standing balance-Leahy Scale: Fair Standing balance comment: Min-to-mod lean on the RW for support                            Cognition Arousal/Alertness: Awake/alert Behavior During Therapy: WFL for tasks assessed/performed Overall Cognitive Status: Within Functional Limits for tasks assessed                                        Exercises Total Joint Exercises Ankle Circles/Pumps: AROM;Both;10 reps;Strengthening Quad Sets: AROM;Strengthening;Left;10 reps Long Arc Quad: AROM;Strengthening;Both;10 reps;15 reps Knee Flexion: AROM;Strengthening;Both;10 reps;15 reps Marching in Standing: AROM;Both;10 reps;Seated;Left;AAROM Other Exercises Other Exercises: Reviewed HEP packet using teach-back method.    General Comments        Pertinent Vitals/Pain Pain Assessment: 0-10 Pain Score: 8  Pain Location:  L knee Pain Descriptors / Indicators: Aching;Sore Pain Intervention(s): Limited activity within patient's tolerance;Monitored during session    Home Living                      Prior Function            PT Goals (current goals can now be found in the care plan section) Progress towards PT  goals: Progressing toward goals    Frequency    BID      PT Plan Current plan remains appropriate    Co-evaluation              AM-PAC PT "6 Clicks" Mobility   Outcome Measure  Help needed turning from your back to your side while in a flat bed without using bedrails?: None Help needed moving from lying on your back to sitting on the side of a flat bed without using bedrails?: A Little Help needed moving to and from a bed to a chair (including a wheelchair)?: A Little Help needed standing up from a chair using your arms (e.g., wheelchair or bedside chair)?: A Little Help needed to walk in hospital room?: A Little Help needed climbing 3-5 steps with a railing? : A Little 6 Click Score: 19    End of Session Equipment Utilized During Treatment: Gait belt Activity Tolerance: Patient limited by fatigue Patient left: in chair;with chair alarm set;with family/visitor present;with SCD's reapplied;Other (comment)(L knee Polar Care donned) Nurse Communication: Mobility status PT Visit Diagnosis: Other abnormalities of gait and mobility (R26.89);Muscle weakness (generalized) (M62.81)     Time: 0488-8916 PT Time Calculation (min) (ACUTE ONLY): 40 min  Charges:                        [name], SPT  08/12/19, 5:23 PM

## 2019-08-12 NOTE — TOC Progression Note (Signed)
Transition of Care [facility]) - Progression Note    Patient Details  Name: Jodi Goodman MRN: [id number] Date of Birth: [birth year]  Transition of Care [facility]) CM/SW Contact  Daishaun Ayre, Lenice Llamas Phone Number: [phone]  08/12/2019, 2:18 PM  Clinical Narrative: Per MD patient will be on Eliquis. Clinical Education officer, museum (CSW) met with patient and provided Eliquis coupon to her. Patient's daughter was at bedside and reported that a rolling walker and a bedside commode have been delivered and daughter has taken them home. CSW will continue to follow and assist as needed.       Expected Discharge Plan: [place] Barriers to Discharge: Continued Medical Work up  Expected Discharge Plan and Services Expected Discharge Plan: [place] In-house Referral: Clinical Social Work   Post Acute Care Choice: [place] arrangements for the past 2 months: Single Family Home                 DME Arranged: Bedside commode, Walker rolling DME Agency: [employer] Date DME Agency Contacted: 08/11/19 Time DME Agency Contacted: [id number] Representative spoke with at DME Agency: [place]: PT [place]: [place] ([place]) Date [place]: 08/11/19 Time [place]: 1103 Representative spoke with at [place]: [place] ([place]) Interventions    Readmission Risk Interventions No flowsheet data found.

## 2019-08-12 NOTE — Progress Notes (Signed)
  Subjective: 2 Days Post-Op Procedure(s) (LRB): TOTAL KNEE ARTHROPLASTY (Left) Patient reports pain as mild.   Patient is well, and has had no acute complaints or problems Plan for d/c home with HHPT. Negative for chest pain and shortness of breath Fever: Low grade temps last night. Gastrointestinal:Negative for nausea and vomiting this AM.  Objective: Vital signs in last 24 hours: Temp:  [97.8 F (36.6 C)-100.2 F (37.9 C)] 98.4 F (36.9 C) (10/22 0726) Pulse Rate:  [90-105] 99 (10/22 0726) Resp:  [16-20] 18 (10/22 0726) BP: (106-140)/(73-95) 121/81 (10/22 0726) SpO2:  [94 %-95 %] 95 % (10/22 0726)  Intake/Output from previous day:  Intake/Output Summary (Last 24 hours) at 08/12/2019 1154 Last data filed at 08/12/2019 0900 Gross per 24 hour  Intake 488.81 ml  Output -  Net 488.81 ml    Intake/Output this shift: Total I/O In: 240 [P.O.:240] Out: -   Labs: Recent Labs    08/10/19 0629 08/11/19 0437 08/12/19 0422  HGB 12.9 10.2* 10.5*   Recent Labs    08/11/19 0437 08/12/19 0422  WBC 10.5 10.9*  RBC 3.35* 3.46*  HCT 30.7* 31.8*  PLT 217 202   Recent Labs    08/11/19 0437 08/12/19 0422  NA 141 140  K 3.2* 3.8  CL 110 106  CO2 23 25  BUN 10 13  CREATININE 0.74 0.77  GLUCOSE 139* 129*  CALCIUM 8.9 9.3   No results for input(s): LABPT, INR in the last 72 hours.   EXAM General - Patient is Alert, Appropriate and Oriented Extremity - ABD soft Sensation intact distally Intact pulses distally Dorsiflexion/Plantar flexion intact Incision: moderate drainage No cellulitis present Dressing/Incision - blood tinged drainage to the honeycomb dressing, new dressing applied this AM. Motor Function - intact, moving foot and toes well on exam.   Past Medical History:  Diagnosis Date  . Anemia    H/O  . Anxiety   . Arthritis   . Bruises easily   . Dysrhythmia    PALPITATIONS  . GERD (gastroesophageal reflux disease)   . Hyperlipidemia   .  Hypertension     Assessment/Plan: 2 Days Post-Op Procedure(s) (LRB): TOTAL KNEE ARTHROPLASTY (Left) Active Problems:   Status post total knee replacement using cement, left  Estimated body mass index is 24.72 kg/m as calculated from the following:   Height as of this encounter: 5\' 4"  (1.626 m).   Weight as of this encounter: 65.3 kg. Advance diet Up with therapy D/C IV fluids when tolerating po intake.  Labs reviewed this AM, Recent fevers, WBC 10.9 this AM. Hypokalemia resolved. Denies any SOB or chest pain. Patient has had a BM. Will d/c home on Eliquis. Plan for possible d/c home today pending progress.  DVT Prophylaxis - Lovenox and Foot Pumps Weight-Bearing as tolerated to left leg  J. Cameron Proud, PA-C [facility] Orthopaedic Surgery 08/12/2019, 11:54 AM

## 2019-08-12 NOTE — Progress Notes (Signed)
Physical Therapy Treatment Patient Details Name: Jodi Goodman MRN: [id number] DOB: 1951/01/05 Today's Date: 08/12/2019    History of Present Illness Pt is a 68 yo female diagnosed with DJD of the L knee and is s/p elective L TKA.  PMH includes: HTN, GERD, and anxiety.    PT Comments    Pt presented with deficits in strength, transfers, mobility, gait, balance, activity tolerance, and L knee ROM. Pt's pain was 7/10 initially, and reduced with functional activity. L knee AROM was 15-72 (3-degree decrease from yesterday). With mod cuing, pt progressed to a slow step-through gait pattern and min lean on the RW. Pt was educated on stair navigation, and used a safe and slow pattern using both rails with good eccentric and concentric control. Vital were monitored after walking ~90 ft, and after ascending and descending the stairs, and were [facility]. At most, pt required close CGA for safety while on stairs, however her pace was very slow which can put her at an elevated risk for falls. Pt will benefit from HHPT services upon discharge to safely address above deficits for decreased caregiver assistance and eventual return to PLOF.    Follow Up Recommendations  Home health PT;Supervision for mobility/OOB     Equipment Recommendations  Rolling walker with 5" wheels;3in1 (PT)    Recommendations for Other Services       Precautions / Restrictions Precautions Precautions: Knee Precaution Booklet Issued: Yes (comment) Restrictions Weight Bearing Restrictions: Yes LLE Weight Bearing: Weight bearing as tolerated    Mobility  Bed Mobility Overal bed mobility: Needs Assistance Bed Mobility: Supine to Sit;Sit to Supine     Supine to sit: Supervision Sit to supine: Supervision   General bed mobility comments: Extra time and effort required during sup to sit, briefly reached out for help but didn't need it to get to EOB  Transfers Overall transfer level: Needs assistance Equipment used:  Rolling walker (2 wheeled) Transfers: Sit to/from Stand Sit to Stand: Supervision         General transfer comment: Good carry over, min cuing required  Ambulation/Gait Ambulation/Gait assistance: Min guard Gait Distance (Feet): 180 Feet (90 ft x 2) Assistive device: Rolling walker (2 wheeled) Gait Pattern/deviations: Step-through pattern;Decreased step length - right;Decreased step length - left Gait velocity: Decreased   General Gait Details: Initially used slow step-to pattern, but with mod cuing progressed to a slow step-through pattern, with min-to-mod lean on the RW for support and no LOB or L knee buckling noted.   Stairs Stairs: Yes Stairs assistance: Min guard Stair Management: Two rails Number of Stairs: 4 General stair comments: Pt was familiar with "up with the good, down with the bad" pattern, and had good eccentric and concentric control, without buckling or LOB.   Wheelchair Mobility    Modified Rankin (Stroke Patients Only)       Balance Overall balance assessment: Needs assistance Sitting-balance support: Single extremity supported;Feet unsupported Sitting balance-Leahy Scale: Good     Standing balance support: Bilateral upper extremity supported;During functional activity Standing balance-Leahy Scale: Fair Standing balance comment: Min-to-mod lean on the RW for support                            Cognition Arousal/Alertness: Awake/alert Behavior During Therapy: WFL for tasks assessed/performed Overall Cognitive Status: Within Functional Limits for tasks assessed  Exercises Total Joint Exercises Ankle Circles/Pumps: AROM;Both;10 reps;Strengthening Quad Sets: AROM;Strengthening;Left;10 reps Hip ABduction/ADduction: AROM;Right;5 reps;AAROM;Left;10 reps Straight Leg Raises: AAROM;Left;5 reps Long Arc Quad: AROM;Strengthening;Both;10 reps;15 reps Knee Flexion:  AROM;Strengthening;Both;10 reps;15 reps Goniometric ROM: L knee AROM: 15-72 Marching in Standing: AROM;Standing;Both;10 reps Other Exercises Other Exercises: Pt completed stairs training in gym.    General Comments        Pertinent Vitals/Pain Pain Score: 7  Pain Location: L knee Pain Descriptors / Indicators: Aching;Sore Pain Intervention(s): Limited activity within patient's tolerance;Monitored during session    Home Living                      Prior Function            PT Goals (current goals can now be found in the care plan section) Progress towards PT goals: Progressing toward goals    Frequency    BID      PT Plan Current plan remains appropriate    Co-evaluation              AM-PAC PT "6 Clicks" Mobility   Outcome Measure  Help needed turning from your back to your side while in a flat bed without using bedrails?: None Help needed moving from lying on your back to sitting on the side of a flat bed without using bedrails?: A Little Help needed moving to and from a bed to a chair (including a wheelchair)?: A Little Help needed standing up from a chair using your arms (e.g., wheelchair or bedside chair)?: A Little Help needed to walk in hospital room?: A Little Help needed climbing 3-5 steps with a railing? : A Little 6 Click Score: 19    End of Session Equipment Utilized During Treatment: Gait belt Activity Tolerance: Patient tolerated treatment well Patient left: in chair;with chair alarm set;with family/visitor present;with SCD's reapplied;Other (comment)(K knee Polar Care donned) Nurse Communication: Mobility status PT Visit Diagnosis: Other abnormalities of gait and mobility (R26.89);Muscle weakness (generalized) (M62.81)     Time: 4332-9518 PT Time Calculation (min) (ACUTE ONLY): 59 min  Charges:                        Juanda Crumble "Gus" Jeannette Corpus, SPT  08/12/19, 12:38 PM

## 2019-08-13 LAB — BASIC METABOLIC PANEL
Anion gap: 11 (ref 5–15)
BUN: 17 mg/dL (ref 8–23)
CO2: 24 mmol/L (ref 22–32)
Calcium: 9.2 mg/dL (ref 8.9–10.3)
Chloride: 101 mmol/L (ref 98–111)
Creatinine, Ser: 0.82 mg/dL (ref 0.44–1.00)
GFR calc Af Amer: >60 mL/min (ref >60)
GFR calc non Af Amer: >60 mL/min (ref >60)
Glucose, Bld: 139 mg/dL — ABNORMAL HIGH (ref 70–99)
Potassium: 3.3 mmol/L — ABNORMAL LOW (ref 3.5–5.1)
Sodium: 136 mmol/L (ref 135–145)

## 2019-08-13 MED ORDER — POTASSIUM CHLORIDE 20 MEQ PO PACK
40.0000 meq | PACK | Freq: Once | ORAL | Status: AC
  Administered 2019-08-13: 09:00:00 40 meq via ORAL
  Filled 2019-08-13: qty 2

## 2019-08-13 NOTE — Care Management Important Message (Signed)
Important Message  Patient Details  Name: Jodi Goodman MRN: [id number] Date of Birth: October 11, 1951   Medicare Important Message Given:  Yes     [name] 08/13/2019, 10:13 AM

## 2019-08-13 NOTE — Progress Notes (Signed)
  Subjective: 3 Days Post-Op Procedure(s) (LRB): TOTAL KNEE ARTHROPLASTY (Left) Patient reports pain as mild.   Patient is well, and has had no acute complaints or problems Plan for d/c home with HHPT later today. Negative for chest pain and shortness of breath Fever: Low grade temps last night. Gastrointestinal:Negative for nausea and vomiting this AM.  Objective: Vital signs in last 24 hours: Temp:  [98.2 F (36.8 C)-99.7 F (37.6 C)] 99.7 F (37.6 C) (10/22 2216) Pulse Rate:  [91-108] 108 (10/22 2216) Resp:  [16-18] 18 (10/22 2216) BP: (112-113)/(74-77) 113/74 (10/22 2216) SpO2:  [100 %] 100 % (10/22 1508)  Intake/Output from previous day:  Intake/Output Summary (Last 24 hours) at 08/13/2019 0747 Last data filed at 08/13/2019 0400 Gross per 24 hour  Intake 840 ml  Output 150 ml  Net 690 ml    Intake/Output this shift: No intake/output data recorded.  Labs: Recent Labs    08/11/19 0437 08/12/19 0422  HGB 10.2* 10.5*   Recent Labs    08/11/19 0437 08/12/19 0422  WBC 10.5 10.9*  RBC 3.35* 3.46*  HCT 30.7* 31.8*  PLT 217 202   Recent Labs    08/12/19 0422 08/13/19 0430  NA 140 136  K 3.8 3.3*  CL 106 101  CO2 25 24  BUN 13 17  CREATININE 0.77 0.82  GLUCOSE 129* 139*  CALCIUM 9.3 9.2   No results for input(s): LABPT, INR in the last 72 hours.   EXAM General - Patient is Alert, Appropriate and Oriented Extremity - ABD soft Sensation intact distally Intact pulses distally Dorsiflexion/Plantar flexion intact Incision: moderate drainage No cellulitis present Dressing/Incision - blood tinged drainage to the honeycomb dressing, new dressing applied this AM. Motor Function - intact, moving foot and toes well on exam.   Past Medical History:  Diagnosis Date  . Anemia    H/O  . Anxiety   . Arthritis   . Bruises easily   . Dysrhythmia    PALPITATIONS  . GERD (gastroesophageal reflux disease)   . Hyperlipidemia   . Hypertension      Assessment/Plan: 3 Days Post-Op Procedure(s) (LRB): TOTAL KNEE ARTHROPLASTY (Left) Active Problems:   Status post total knee replacement using cement, left  Estimated body mass index is 24.72 kg/m as calculated from the following:   Height as of this encounter: 5\' 4"  (1.626 m).   Weight as of this encounter: 65.3 kg. Advance diet Up with therapy D/C IV fluids when tolerating po intake.  Labs reviewed this AM, Recent fevers but improving. K+ 3.3 this AM, will supplement this monring. Denies any SOB or chest pain. Patient has had a BM. Will d/c home on Eliquis. Plan for d/c home today.  DVT Prophylaxis - Lovenox and Foot Pumps Weight-Bearing as tolerated to left leg  J. Cameron Proud, PA-C [facility] Orthopaedic Surgery 08/13/2019, 7:47 AM

## 2019-08-13 NOTE — Progress Notes (Signed)
Discharge summary reviewed with verbal understanding. RXs sent to pharmacy. Pt stated that CPM machine left a mark. This Probation officer reassessed, no visible area. This Probation officer had provided cushion to patient after application of CPM  By MD and noted it was removed by patient prior to reassessment

## 2019-08-13 NOTE — Discharge Summary (Signed)
Physician Discharge Summary  Patient ID: Jodi Goodman MRN: [id number] DOB/AGE: Oct 12, 1951 68 y.o.  Admit date: 08/10/2019 Discharge date: 08/13/2019  Admission Diagnoses:  PRIMARY OSTEOARTHRITIS OF LEFT KNEE  Discharge Diagnoses: Patient Active Problem List   Diagnosis Date Noted  . 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 Chronic pain of left knee 02/11/2019  . Chronic pain syndrome 02/11/2019  . Primary osteoarthritis of left knee 05/08/2015   Past Medical History:  Diagnosis Date  . Anemia    H/O  . Anxiety   . Arthritis   . Bruises easily   . Dysrhythmia    PALPITATIONS  . GERD (gastroesophageal reflux disease)   . Hyperlipidemia   . Hypertension      Transfusion: None.   Consultants (if any):   Discharged Condition: Improved  Hospital Course: Jodi Goodman is an 68 y.o. female who was admitted 08/10/2019 with a diagnosis of left knee osteoarthritis and went to the operating room on 08/10/2019 and underwent the above named procedures.    Surgeries: Procedure(s): TOTAL KNEE ARTHROPLASTY on 08/10/2019 Patient tolerated the surgery well. Taken to PACU where she was stabilized and then transferred to the orthopedic floor.  Started on Lovenox 40mg  q 24 hrs. Foot pumps applied bilaterally at 80 mm. Heels elevated on bed with rolled towels. No evidence of DVT. Negative [place]. Physical therapy started on day #1 for gait training and transfer. OT started day #1 for ADL and assisted devices.  Patient's IV was removed on POD3.  Foley removed on POD1.  Implants:  Left TKA using all-cemented [employer] Vanguard system with a 67.5 mm mm PCR femur, a 71 mm tibial tray with a 12 mm anterior stabilized E-poly insert, and a 34 x 8.5 mm all-poly 3-pegged domed patella.  She was given perioperative antibiotics:  Anti-infectives (From admission, onward)   Start     Dose/Rate Route Frequency Ordered Stop   08/10/19 1400  clindamycin (CLEOCIN) IVPB  600 mg     600 mg 100 mL/hr over 30 Minutes Intravenous Every 6 hours 08/10/19 1224 08/11/19 0419   08/10/19 0628  clindamycin (CLEOCIN) 900 MG/50ML IVPB    Note to Pharmacy: Jodi Goodman, Ashley   : cabinet override      08/10/19 0628 08/10/19 0801   08/10/19 0415  clindamycin (CLEOCIN) IVPB 900 mg     900 mg 100 mL/hr over 30 Minutes Intravenous  Once 08/10/19 0405 08/10/19 0813    .  She was given sequential compression devices, early ambulation, and Lovenox for DVT prophylaxis.  She benefited maximally from the hospital stay and there were no complications.    Recent vital signs:  Vitals:   08/12/19 1508 08/12/19 2216  BP: 112/77 113/74  Pulse: 91 (!) 108  Resp: 16 18  Temp: 98.2 F (36.8 C) 99.7 F (37.6 C)  SpO2: 100%     Recent laboratory studies:  Lab Results  Component Value Date   HGB 10.5 (L) 08/12/2019   HGB 10.2 (L) 08/11/2019   HGB 12.9 08/10/2019   Lab Results  Component Value Date   WBC 10.9 (H) 08/12/2019   PLT 202 08/12/2019   No results found for: INR Lab Results  Component Value Date   NA 136 08/13/2019   K 3.3 (L) 08/13/2019   CL 101 08/13/2019   CO2 24 08/13/2019   BUN 17 08/13/2019   CREATININE 0.82 08/13/2019   GLUCOSE 139 (H) 08/13/2019    Discharge Medications:   Allergies as of 08/13/2019  Reactions   Latex Hives   Penicillins Hives   Did it involve swelling of the face/tongue/throat, SOB, or low BP? Unknown Did it involve sudden or severe rash/hives, skin peeling, or any reaction on the inside of your mouth or nose? Yes Did you need to seek medical attention at a hospital or doctor's office? No When did it last happen?Over 40 years ago If all above answers are "NO", may proceed with cephalosporin use.   Aspirin Nausea Only   Patient can tolerate coated aspirin   Sulfa Antibiotics Hives, Other (See Comments)   DRY MOUTH      Medication List    TAKE these medications   ADULT GUMMY PO Take 2 tablets by mouth daily  with lunch. One-A-Day Women Gummy   ALPRAZolam 0.25 MG tablet Commonly known as: XANAX Take 0.25 mg by mouth 2 (two) times daily as needed for anxiety.   apixaban 2.5 MG Tabs tablet Commonly known as: Eliquis Take 1 tablet (2.5 mg total) by mouth 2 (two) times daily.   ASPERCREME LIDOCAINE EX Apply 1 application topically 3 (three) times daily as needed (pain.).   calcium carbonate 750 MG chewable tablet Commonly known as: TUMS EX Chew 1 tablet by mouth 3 (three) times daily as needed for heartburn.   chlorthalidone 25 MG tablet Commonly known as: HYGROTON Take 12.5 mg by mouth at bedtime.   ezetimibe 10 MG tablet Commonly known as: ZETIA Take 10 mg by mouth daily with lunch.   famotidine 20 MG tablet Commonly known as: PEPCID Take 20 mg by mouth 2 (two) times daily as needed for heartburn or indigestion.   ondansetron 4 MG tablet Commonly known as: ZOFRAN Take 1 tablet (4 mg total) by mouth every 6 (six) hours as needed for nausea.   OVER THE COUNTER MEDICATION Apply 1 application topically 3 (three) times daily as needed (for pain.). Hempvana Pain Relief Cream   oxyCODONE 5 MG immediate release tablet Commonly known as: Oxy IR/ROXICODONE Take 1-2 tablets (5-10 mg total) by mouth every 4 (four) hours as needed for moderate pain.   ramipril 10 MG capsule Commonly known as: ALTACE Take 10 mg by mouth at bedtime.   Systane 0.4-0.3 % Soln Generic drug: Polyethyl Glycol-Propyl Glycol Place 1 drop into both eyes 3 (three) times daily as needed (dry/irritated eyes.).   traMADol 50 MG tablet Commonly known as: ULTRAM Take 1-2 tablets (50-100 mg total) by mouth every 6 (six) hours as needed for moderate pain.            Durable Medical Equipment  (From admission, onward)         Start     Ordered   08/10/19 1225  DME Bedside commode  Once    Question:  Patient needs a bedside commode to treat with the following condition  Answer:  Status post total knee  replacement using cement, left   08/10/19 1224   08/10/19 1225  DME 3 n 1  Once     08/10/19 1224   08/10/19 1225  DME Walker rolling  Once    Question:  Patient needs a walker to treat with the following condition  Answer:  Status post total knee replacement using cement, left   08/10/19 1224          Diagnostic Studies: Dg Knee Left Port  Result Date: 08/10/2019 CLINICAL DATA:  Left knee replacement EXAM: PORTABLE LEFT KNEE - 1-2 VIEW COMPARISON:  None. FINDINGS: Changes of left knee replacement. No hardware  bony complicating feature. Joint space and soft tissue gas present. IMPRESSION: Left knee replacement.  No visible complicating feature. Electronically Signed   By: Charlett Nose M.D.   On: 08/10/2019 11:00   Disposition: Plan for d/c home this afternoon after PT session this morning.  Will discharge on Eliquis oral medication.  Follow-up Information    Anson [place], PA-C Follow up in 14 day(s).   Specialty: Physician Assistant Why: Mindi Slicker information: [address] [place] [address] [phone]          Signed: Meriel Pica PA-C 08/13/2019, 7:50 AM

## 2019-08-13 NOTE — Discharge Instructions (Signed)
Diet: As you were doing prior to hospitalization   Shower:  May shower but keep the wounds dry, use an occlusive plastic wrap, NO SOAKING IN TUB.  If the bandage gets wet, change with a clean dry gauze.  Dressing:  You may change your dressing as needed. Change the dressing with sterile gauze dressing.    Activity:  Increase activity slowly as tolerated, but follow the weight bearing instructions below.  No lifting or driving for 6 weeks.  Weight Bearing:  Weightbearing as tolerated to the left leg.  To prevent constipation: you may use a stool softener such as -  Colace (over the counter) 100 mg by mouth twice a day  Drink plenty of fluids (prune juice may be helpful) and high fiber foods Miralax (over the counter) for constipation as needed.    Itching:  If you experience itching with your medications, try taking only a single pain pill, or even half a pain pill at a time.  You may take up to 10 pain pills per day, and you can also use benadryl over the counter for itching or also to help with sleep.   Precautions:  If you experience chest pain or shortness of breath - call 911 immediately for transfer to the hospital emergency department!!  If you develop a fever greater that 101 F, purulent drainage from wound, increased redness or drainage from wound, or calf pain-Call [facility]                                              Follow- Up Appointment:  Please call for an appointment to be seen in 2 weeks at [facility]  

## 2019-08-13 NOTE — Progress Notes (Signed)
Physical Therapy Treatment Patient Details Name: Jodi Goodman MRN: [id number] DOB: 10-22-1950 Today's Date: 08/13/2019    History of Present Illness Pt is a 68 yo female diagnosed with DJD of the L knee and is s/p elective L TKA.  PMH includes: HTN, GERD, and anxiety.    PT Comments    Pt presented with deficits in strength, transfers, mobility, gait, balance, activity tolerance, and L knee ROM. Pt's pain was 6-7/10 initially, and decreased somewhat with functional activity. Pt's L knee AROM increased to 8-79 (previously 15-72). Pt and daughter/caregiver, Erskine Squibb, were both educated on car transfer pattern via a simulated sedan in the gym, as well as re-educated on ascent & descent of stairs with the pair working together, and they completed training without issue. Pt completed sit<>stand transfers to and from bed, commode, and exercise table in gym with CGA. Pt will benefit from HHPT services upon discharge to safely address above deficits for decreased caregiver assistance and eventual return to PLOF.    Follow Up Recommendations  Home health PT;Supervision for mobility/OOB     Equipment Recommendations  Rolling walker with 5" wheels;3in1 (PT)    Recommendations for Other Services       Precautions / Restrictions Precautions Precautions: Knee Precaution Booklet Issued: Yes (comment) Restrictions Weight Bearing Restrictions: Yes LLE Weight Bearing: Weight bearing as tolerated    Mobility  Bed Mobility Overal bed mobility: Needs Assistance Bed Mobility: Supine to Sit;Sit to Supine     Supine to sit: Min assist Sit to supine: Min assist   General bed mobility comments: Pt was required min A to clear LLE over EOB.  Transfers Overall transfer level: Needs assistance Equipment used: Rolling walker (2 wheeled) Transfers: Sit to/from Stand Sit to Stand: Supervision         General transfer comment: Good carry over, min cuing required  Ambulation/Gait Ambulation/Gait  assistance: Min guard Gait Distance (Feet): 200 Feet Assistive device: Rolling walker (2 wheeled) Gait Pattern/deviations: Step-through pattern;Decreased step length - right;Decreased step length - left;Antalgic Gait velocity: Decreased   General Gait Details: Initially used slow step-to pattern, but with min cuing progressed to a slow step-through pattern, with min-to-mod lean on the RW for support and min antalgic gait on L weightbearing.   Stairs Stairs: Yes Stairs assistance: Min guard Stair Management: Two rails Number of Stairs: 4 General stair comments: Reinforced "up with the good, down with the bad" pattern, and had good eccentric and concentric control, without buckling or LOB.   Wheelchair Mobility    Modified Rankin (Stroke Patients Only)       Balance Overall balance assessment: Needs assistance Sitting-balance support: Single extremity supported;Feet unsupported Sitting balance-Leahy Scale: Good     Standing balance support: Bilateral upper extremity supported;During functional activity Standing balance-Leahy Scale: Fair Standing balance comment: Min-to-mod lean on the RW for support                            Cognition Arousal/Alertness: Awake/alert Behavior During Therapy: WFL for tasks assessed/performed Overall Cognitive Status: Within Functional Limits for tasks assessed                                        Exercises Total Joint Exercises Ankle Circles/Pumps: AROM;Both;10 reps;Strengthening Quad Sets: AROM;Strengthening;Left;10 reps Hip ABduction/ADduction: AROM;Right;5 reps;AAROM;Left;10 reps Goniometric ROM: Lknee AROM: 8-79 Marching in Standing: Strengthening;Both;5 reps Other  Exercises Other Exercises: Pt and daughter/caregiver, Opal Sidles, edcuation on car transfer training.    General Comments        Pertinent Vitals/Pain Pain Assessment: 0-10 Pain Score: 7  Pain Location: L knee Pain Descriptors / Indicators:  Sore;Dull Pain Intervention(s): Limited activity within patient's tolerance;Monitored during session    Home Living                      Prior Function            PT Goals (current goals can now be found in the care plan section) Progress towards PT goals: Progressing toward goals    Frequency    BID      PT Plan Current plan remains appropriate    Co-evaluation              AM-PAC PT "6 Clicks" Mobility   Outcome Measure  Help needed turning from your back to your side while in a flat bed without using bedrails?: A Little Help needed moving from lying on your back to sitting on the side of a flat bed without using bedrails?: A Little Help needed moving to and from a bed to a chair (including a wheelchair)?: A Little Help needed standing up from a chair using your arms (e.g., wheelchair or bedside chair)?: A Little Help needed to walk in hospital room?: A Little Help needed climbing 3-5 steps with a railing? : A Little 6 Click Score: 18    End of Session Equipment Utilized During Treatment: Gait belt Activity Tolerance: Patient tolerated treatment well Patient left: in bed;with call bell/phone within reach;with bed alarm set;with family/visitor present;with SCD's reapplied;Other (comment)(L knee Polar Care donned.) Nurse Communication: Mobility status PT Visit Diagnosis: Other abnormalities of gait and mobility (R26.89);Muscle weakness (generalized) (M62.81)     Time: 0254-2706 PT Time Calculation (min) (ACUTE ONLY): 56 min  Charges:                        Juanda Crumble "Gus" Jeannette Corpus, SPT  08/13/19, 12:28 PM

## 2019-08-13 NOTE — TOC Transition Note (Signed)
Transition of Care [facility]) - CM/SW Discharge Note   Patient Details  Name: Jodi Goodman MRN: [id number] Date of Birth: 11/04/1950  Transition of [facility]) CM/SW Contact:  Naoma Boxell, Lenice Llamas Phone Number: [phone]  08/13/2019, 11:35 AM   Clinical Narrative: Clinical Social Worker (CSW) notified [place] representative that patient will D/C home today. [employer] agency representative has delivered rolling walker and bedside commode to patient's room and patient's daughter has taken DME home. Patient will D/C on Eliquis and has Eliquis coupon. Please reconsult if future social work needs arise. CSW signing off.     Final next level of care: [place] Barriers to Discharge: Barriers Resolved   Patient Goals and CMS Choice Patient states their goals for this hospitalization and ongoing recovery are:: To go home. CMS Medicare.gov Compare Post Acute Care list provided to:: Patient Choice offered to / list presented to : Patient  Discharge Placement                       Discharge Plan and Services In-house Referral: Clinical Social Work   Post Acute Care Choice: Home Health          DME Arranged: Bedside commode, Walker rolling DME Agency: [employer] Date DME Agency Contacted: 08/11/19 Time DME Agency Contacted: [id number] Representative spoke with at DME Agency: [place]: PT [place]: Uncertain ([place]) Date [place]: 08/13/19 Time [place]: 1443 Representative spoke with at [place]: [place] ([place]) Interventions     Readmission Risk Interventions No flowsheet data found.

## 2019-08-14 ENCOUNTER — Encounter: Payer: Self-pay | Admitting: Emergency Medicine

## 2019-08-14 ENCOUNTER — Other Ambulatory Visit: Payer: Self-pay

## 2019-08-14 ENCOUNTER — Emergency Department
Admission: EM | Admit: 2019-08-14 | Discharge: 2019-08-14 | Disposition: A | Discharge status: Home / Self Care | Payer: Medicare HMO | Attending: Emergency Medicine | Admitting: Emergency Medicine

## 2019-08-14 DIAGNOSIS — Z7901 Long term (current) use of anticoagulants: Secondary | ICD-10-CM | POA: Insufficient documentation

## 2019-08-14 DIAGNOSIS — Y658 Other specified misadventures during surgical and medical care: Secondary | ICD-10-CM | POA: Insufficient documentation

## 2019-08-14 DIAGNOSIS — Z79899 Other long term (current) drug therapy: Secondary | ICD-10-CM | POA: Diagnosis not present

## 2019-08-14 DIAGNOSIS — Z91040 Latex allergy status: Secondary | ICD-10-CM | POA: Insufficient documentation

## 2019-08-14 DIAGNOSIS — M96830 Postprocedural hemorrhage and hematoma of a musculoskeletal structure following a musculoskeletal system procedure: Secondary | ICD-10-CM | POA: Diagnosis present

## 2019-08-14 DIAGNOSIS — T8131XA Disruption of external operation (surgical) wound, not elsewhere classified, initial encounter: Secondary | ICD-10-CM | POA: Diagnosis not present

## 2019-08-14 DIAGNOSIS — Z96652 Presence of left artificial knee joint: Secondary | ICD-10-CM | POA: Diagnosis not present

## 2019-08-14 LAB — CBC WITH DIFFERENTIAL/PLATELET
Abs Immature Granulocytes: 0.2 10*3/uL — ABNORMAL HIGH (ref 0.00–0.07)
Basophils Absolute: 0 10*3/uL (ref 0.0–0.1)
Basophils Relative: 0 %
Eosinophils Absolute: 0 10*3/uL (ref 0.0–0.5)
Eosinophils Relative: 0 %
HCT: 27.5 % — ABNORMAL LOW (ref 36.0–46.0)
Hemoglobin: 9.2 g/dL — ABNORMAL LOW (ref 12.0–15.0)
Immature Granulocytes: 2 %
Lymphocytes Relative: 13 %
Lymphs Abs: 1.7 10*3/uL (ref 0.7–4.0)
MCH: 30.7 pg (ref 26.0–34.0)
MCHC: 33.5 g/dL (ref 30.0–36.0)
MCV: 91.7 fL (ref 80.0–100.0)
Monocytes Absolute: 1.6 10*3/uL — ABNORMAL HIGH (ref 0.1–1.0)
Monocytes Relative: 12 %
Neutro Abs: 9.7 10*3/uL — ABNORMAL HIGH (ref 1.7–7.7)
Neutrophils Relative %: 73 %
Platelets: 235 10*3/uL (ref 150–400)
RBC: 3 MIL/uL — ABNORMAL LOW (ref 3.87–5.11)
RDW: 13.2 % (ref 11.5–15.5)
WBC: 13.3 10*3/uL — ABNORMAL HIGH (ref 4.0–10.5)
nRBC: 0.3 % — ABNORMAL HIGH (ref 0.0–0.2)

## 2019-08-14 LAB — BASIC METABOLIC PANEL
Anion gap: 13 (ref 5–15)
BUN: 19 mg/dL (ref 8–23)
CO2: 24 mmol/L (ref 22–32)
Calcium: 9.3 mg/dL (ref 8.9–10.3)
Chloride: 100 mmol/L (ref 98–111)
Creatinine, Ser: 0.71 mg/dL (ref 0.44–1.00)
GFR calc Af Amer: >60 mL/min (ref >60)
GFR calc non Af Amer: >60 mL/min (ref >60)
Glucose, Bld: 119 mg/dL — ABNORMAL HIGH (ref 70–99)
Potassium: 3.6 mmol/L (ref 3.5–5.1)
Sodium: 137 mmol/L (ref 135–145)

## 2019-08-14 LAB — PROTIME-INR
INR: 1.2 (ref 0.8–1.2)
Prothrombin Time: 15 seconds (ref 11.4–15.2)

## 2019-08-14 MED ORDER — OXYCODONE HCL 5 MG PO TABS
5.0000 mg | ORAL_TABLET | Freq: Once | ORAL | Status: AC
  Administered 2019-08-14: 5 mg via ORAL
  Filled 2019-08-14: qty 1

## 2019-08-14 MED ORDER — TRANEXAMIC ACID 1000 MG/10ML IV SOLN
500.0000 mg | Freq: Once | INTRAVENOUS | Status: AC
  Administered 2019-08-14: 500 mg via TOPICAL
  Filled 2019-08-14: qty 10

## 2019-08-14 MED ORDER — ONDANSETRON 4 MG PO TBDP
4.0000 mg | ORAL_TABLET | Freq: Once | ORAL | Status: AC
  Administered 2019-08-14: 15:00:00 4 mg via ORAL
  Filled 2019-08-14: qty 1

## 2019-08-14 NOTE — ED Triage Notes (Signed)
Pt arrived via POV with reports of bleeding through incision site from left knee replacement.  Pt seen by home health today for dressing change.Pt has honey comb dressing in place at this time and wrapped with ace bandage and covered with TED hose thigh highs.   Pt is currently taking Eliquis for post-op only.

## 2019-08-14 NOTE — Discharge Instructions (Addendum)
You have had the bleeding from your surgical site. We changed the dressing and applied a special medicine to promote bleeding cessation. Keep the leg elevated and apply ice to reduce swelling. Take your home meds as directed. Follow-up with Dr. Roland Rack or Mia [place], PA-C on Monday.

## 2019-08-14 NOTE — ED Notes (Signed)
Pt taking to bathroom on a wheelchair. Pt helped back to wheelchair by this RN.

## 2019-08-14 NOTE — ED Provider Notes (Signed)
[facility] Emergency Department Provider Note ____________________________________________  Time seen: 1433  I have reviewed the triage vital signs and the nursing notes.  HISTORY  Chief Complaint  Post-op Problem  HPI Jodi Goodman is a 68 y.o. female presents to the ED for evaluation of continued bleeding status post a total right knee replacement on the left.  Patient is 4 days status post total knee before by Dr. Joice Lofts.  She has been at home receiving physical therapy and home health care.  She was seen on yesterday afternoon prior to hospital discharge, by Horris Latino, PA-C, and was aware of the bleeding from the inferior portion of the wound.  He changed the dressing and discharged the patient with instructions to follow-up in the office on Monday.  Patient returns today after noting increased swelling, stiffness, and warmth to the medial knee.  She also continues to note bleeding to the Tegaderm and honeycomb dressing applied to the anterior surgical wound.  Patient denies any fevers, chills, or sweats patient also denies any chest pain, shortness of breath, or vomiting.  Past Medical History:  Diagnosis Date  . Anemia    H/O  . Anxiety   . Arthritis   . Bruises easily   . Dysrhythmia    PALPITATIONS  . GERD (gastroesophageal reflux disease)   . Hyperlipidemia   . Hypertension     Patient Active Problem List   Diagnosis Date Noted  . 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 Chronic pain of left knee 02/11/2019  . Chronic pain syndrome 02/11/2019  . Primary osteoarthritis of left knee 05/08/2015    Past Surgical History:  Procedure Laterality Date  . CESAREAN SECTION    . COLONOSCOPY    . EYE SURGERY Bilateral    laser to drain fluid  . KNEE ARTHROSCOPY WITH MENISCAL REPAIR Left 09/26/2015   Procedure: KNEE ARTHROSCOPY WITH partial lateral menisectomy and abrasion chondoplasty of patella.;  Surgeon: Christena Flake,  MD;  Location: [facility] ORS;  Service: Orthopedics;  Laterality: Left;  . 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   Procedure: TOTAL KNEE ARTHROPLASTY;  Surgeon: Christena Flake, MD;  Location: [facility] ORS;  Service: Orthopedics;  Laterality: Left;  . WISDOM TOOTH EXTRACTION      Prior to Admission medications   Medication Sig Start Date End Date Taking? Authorizing Provider  ALPRAZolam (XANAX) 0.25 MG tablet Take 0.25 mg by mouth 2 (two) times daily as needed for anxiety.    [provider]  apixaban (ELIQUIS) 2.5 MG TABS tablet Take 1 tablet (2.5 mg total) by mouth 2 (two) times daily. 08/12/19   Anson [place], PA-C  ASPERCREME LIDOCAINE EX Apply 1 application topically 3 (three) times daily as needed (pain.).    [provider]  calcium carbonate (TUMS EX) 750 MG chewable tablet Chew 1 tablet by mouth 3 (three) times daily as needed for heartburn.    [provider]  chlorthalidone (HYGROTON) 25 MG tablet Take 12.5 mg by mouth at bedtime.     [provider]  ezetimibe (ZETIA) 10 MG tablet Take 10 mg by mouth daily with lunch.    [provider]  famotidine (PEPCID) 20 MG tablet Take 20 mg by mouth 2 (two) times daily as needed for heartburn or indigestion.    [provider]  Multiple Vitamins-Minerals (ADULT GUMMY PO) Take 2 tablets by mouth daily with lunch. One-A-Day Women Gummy    [provider]  ondansetron [facility])  4 MG tablet Take 1 tablet (4 mg total) by mouth every 6 (six) hours as needed for nausea. 08/12/19   Anson OregonMcGhee, James Lance, PA-C  OVER THE COUNTER MEDICATION Apply 1 application topically 3 (three) times daily as needed (for pain.). Hempvana Pain Relief Cream    [provider]  oxyCODONE (OXY IR/ROXICODONE) 5 MG immediate release tablet Take 1-2 tablets (5-10 mg total) by mouth every 4 (four) hours as needed for moderate pain. 08/12/19   Anson OregonMcGhee, James Lance, PA-C  Polyethyl Glycol-Propyl Glycol (SYSTANE)  0.4-0.3 % SOLN Place 1 drop into both eyes 3 (three) times daily as needed (dry/irritated eyes.).    [provider]  ramipril (ALTACE) 10 MG capsule Take 10 mg by mouth at bedtime.    [provider]  traMADol (ULTRAM) 50 MG tablet Take 1-2 tablets (50-100 mg total) by mouth every 6 (six) hours as needed for moderate pain. 08/12/19   Anson OregonMcGhee, James Lance, PA-C    Allergies Latex, Penicillins, Aspirin, and Sulfa antibiotics  No family history on file.  Social History Social History   Tobacco Use  . Smoking status: Never Smoker  . Smokeless tobacco: Never Used  Substance Use Topics  . Alcohol use: No  . Drug use: No    Review of Systems  Constitutional: Negative for fever. Cardiovascular: Negative for chest pain. Respiratory: Negative for shortness of breath. Gastrointestinal: Negative for abdominal pain, vomiting and diarrhea. Genitourinary: Negative for dysuria. Musculoskeletal: Negative for back pain.  Left knee bleeding and swelling status post TKA. Skin: Negative for rash. Neurological: Negative for headaches, focal weakness or numbness. ____________________________________________  PHYSICAL EXAM:  VITAL SIGNS: ED Triage Vitals  Enc Vitals Group     BP 08/14/19 1252 113/68     Pulse Rate 08/14/19 1252 95     Resp 08/14/19 1252 16     Temp 08/14/19 1252 98.5 F (36.9 C)     Temp Source 08/14/19 1252 Oral     SpO2 08/14/19 1252 99 %     Weight 08/14/19 1256 144 lb (65.3 kg)     Height 08/14/19 1256 5\' 4"  (1.626 m)     Head Circumference --      Peak Flow --      Pain Score 08/14/19 1256 7     Pain Loc --      Pain Edu? --      Excl. in GC? --     Constitutional: Alert and oriented. Well appearing and in no distress. Head: Normocephalic and atraumatic. Eyes: Conjunctivae are normal. Normal extraocular movements Cardiovascular: Normal rate, regular rhythm. Normal distal pulses. Respiratory: Normal respiratory effort. No  wheezes/rales/rhonchi. Gastrointestinal: Soft and nontender. No distention. Musculoskeletal: Left knee with anterior surgical scar consistent with recent TKA.  There is honeycomb dressing in place and a Tegaderm on top, with fresh and dark blood noted to the inferior portion of the wound.  The lateral knee joint has some bruising and ecchymosis noted deep to the skin.  Medially, however, the skin is rubor, with warmth, and some swelling.  No wound dehiscence is appreciate along the skin staple line. Patient has decreased flexion extension range on exam.  Nontender with normal range of motion in all extremities.  Neurologic: Normal speech and language. No gross focal neurologic deficits are appreciated. Skin:  Skin is warm, dry and intact. No rash noted. Psychiatric: Mood and affect are normal. Patient exhibits appropriate insight and judgment. ____________________________________________   LABS (pertinent positives/negatives)  Labs Reviewed  CBC WITH  DIFFERENTIAL/PLATELET - Abnormal; Notable for the following components:      Result Value   WBC 13.3 (*)    RBC 3.00 (*)    Hemoglobin 9.2 (*)    HCT 27.5 (*)    nRBC 0.3 (*)    Neutro Abs 9.7 (*)    Monocytes Absolute 1.6 (*)    Abs Immature Granulocytes 0.20 (*)    All other components within normal limits  BASIC METABOLIC PANEL - Abnormal; Notable for the following components:   Glucose, Bld 119 (*)    All other components within normal limits  PROTIME-INR  ____________________________________________  PROCEDURES  Zofran 4 mg ODT Oxycodone IR 5 mg PO TXA topically Opsite Honeycomb dressing Ace bandage. Procedures ____________________________________________  INITIAL IMPRESSION / ASSESSMENT AND PLAN / ED COURSE  ----------------------------------------- 3:18 PM on 08/14/2019 ----------------------------------------- Paged Rudene Christians: he suggests D/C any PT at this time. He wants patient to rest with the leg elevated and see  Poggi/McGhee on Monday.   Patient presents to the ED 3 days postop in 1 day status post discharge for a left total knee replacement.  Patient was concerned over some continued bleeding to the wound, and through her dressing.  She reports to the ED for evaluation of her symptoms.  She on exam is found to have some local swelling and edema to the joint but denies any interim fevers, chills, or sweats.  The wound was redressed using honeycomb matrix from the OR.  TXA was applied topically to the distal part of the wound with some continued bleeding from the staple line.  Patient tolerated the dressing change well and bleeding seems to be controlled at time of discharge.  She is placed in Ace wrap bandage and a TED hose are reapplied.  She will follow-up with Ortho as scheduled.  Return precautions have been reviewed.  Jodi Goodman was evaluated in Emergency Department on 08/14/2019 for the symptoms described in the history of present illness. She was evaluated in the context of the global COVID-19 pandemic, which necessitated consideration that the patient might be at risk for infection with the SARS-CoV-2 virus that causes COVID-19. Institutional protocols and algorithms that pertain to the evaluation of patients at risk for COVID-19 are in a state of rapid change based on information released by regulatory bodies including the CDC and federal and state organizations. These policies and algorithms were followed during the patient's care in the ED. ____________________________________________  FINAL CLINICAL IMPRESSION(S) / ED DIAGNOSES  Final diagnoses:  Wound disruption, post-op, skin, initial encounter      Melvenia Needles, PA-C 08/14/19 1914    Duffy Bruce, MD 08/16/19 [phone]

## 2019-08-14 NOTE — ED Notes (Addendum)
Pt c/o continuous bleeding from knee replacement surgical incision since yesterday. Pt was discharged yesterday from hospital. Pt is also taking eliquis.

## 2019-08-14 NOTE — ED Notes (Signed)
First Nurse Note: Pt to ED, states that she had knee surgery earlier this week, therapist came out this morning and one of staples came out and area will not stop bleeding. Pt is in NAD.

## 2019-09-09 ENCOUNTER — Emergency Department: Payer: Medicare HMO

## 2019-09-09 ENCOUNTER — Inpatient Hospital Stay
Admission: EM | Admit: 2019-09-09 | Discharge: 2019-09-13 | DRG: 373 | Disposition: A | Discharge status: Home / Self Care | Payer: Medicare HMO | Attending: Internal Medicine | Admitting: Internal Medicine

## 2019-09-09 ENCOUNTER — Encounter: Payer: Self-pay | Admitting: Emergency Medicine

## 2019-09-09 ENCOUNTER — Other Ambulatory Visit: Payer: Self-pay

## 2019-09-09 DIAGNOSIS — M1990 Unspecified osteoarthritis, unspecified site: Secondary | ICD-10-CM | POA: Diagnosis present

## 2019-09-09 DIAGNOSIS — I10 Essential (primary) hypertension: Secondary | ICD-10-CM | POA: Diagnosis present

## 2019-09-09 DIAGNOSIS — Z20828 Contact with and (suspected) exposure to other viral communicable diseases: Secondary | ICD-10-CM | POA: Diagnosis present

## 2019-09-09 DIAGNOSIS — A0472 Enterocolitis due to Clostridium difficile, not specified as recurrent: Secondary | ICD-10-CM | POA: Diagnosis not present

## 2019-09-09 DIAGNOSIS — G894 Chronic pain syndrome: Secondary | ICD-10-CM | POA: Diagnosis present

## 2019-09-09 DIAGNOSIS — K529 Noninfective gastroenteritis and colitis, unspecified: Secondary | ICD-10-CM | POA: Diagnosis not present

## 2019-09-09 DIAGNOSIS — F419 Anxiety disorder, unspecified: Secondary | ICD-10-CM | POA: Diagnosis present

## 2019-09-09 DIAGNOSIS — Z7901 Long term (current) use of anticoagulants: Secondary | ICD-10-CM

## 2019-09-09 DIAGNOSIS — E785 Hyperlipidemia, unspecified: Secondary | ICD-10-CM | POA: Diagnosis present

## 2019-09-09 DIAGNOSIS — Z96652 Presence of left artificial knee joint: Secondary | ICD-10-CM | POA: Diagnosis present

## 2019-09-09 DIAGNOSIS — E860 Dehydration: Secondary | ICD-10-CM | POA: Diagnosis present

## 2019-09-09 DIAGNOSIS — K219 Gastro-esophageal reflux disease without esophagitis: Secondary | ICD-10-CM | POA: Diagnosis present

## 2019-09-09 DIAGNOSIS — Z7982 Long term (current) use of aspirin: Secondary | ICD-10-CM

## 2019-09-09 DIAGNOSIS — E876 Hypokalemia: Secondary | ICD-10-CM | POA: Diagnosis present

## 2019-09-09 DIAGNOSIS — R197 Diarrhea, unspecified: Secondary | ICD-10-CM

## 2019-09-09 LAB — COMPREHENSIVE METABOLIC PANEL
ALT: 19 U/L (ref 0–44)
AST: 28 U/L (ref 15–41)
Albumin: 3.1 g/dL — ABNORMAL LOW (ref 3.5–5.0)
Alkaline Phosphatase: 90 U/L (ref 38–126)
Anion gap: 12 (ref 5–15)
BUN: 12 mg/dL (ref 8–23)
CO2: 22 mmol/L (ref 22–32)
Calcium: 8.8 mg/dL — ABNORMAL LOW (ref 8.9–10.3)
Chloride: 104 mmol/L (ref 98–111)
Creatinine, Ser: 0.78 mg/dL (ref 0.44–1.00)
GFR calc Af Amer: >60 mL/min (ref >60)
GFR calc non Af Amer: >60 mL/min (ref >60)
Glucose, Bld: 108 mg/dL — ABNORMAL HIGH (ref 70–99)
Potassium: 2.7 mmol/L — CL (ref 3.5–5.1)
Sodium: 138 mmol/L (ref 135–145)
Total Bilirubin: 0.8 mg/dL (ref 0.3–1.2)
Total Protein: 6.4 g/dL — ABNORMAL LOW (ref 6.5–8.1)

## 2019-09-09 LAB — GASTROINTESTINAL PANEL BY PCR, STOOL (REPLACES STOOL CULTURE)

## 2019-09-09 LAB — C DIFFICILE QUICK SCREEN W PCR REFLEX
C Diff antigen: POSITIVE — AB
C Diff interpretation: DETECTED
C Diff toxin: POSITIVE — AB

## 2019-09-09 LAB — CBC
HCT: 34.4 % — ABNORMAL LOW (ref 36.0–46.0)
Hemoglobin: 11.5 g/dL — ABNORMAL LOW (ref 12.0–15.0)
MCH: 30.1 pg (ref 26.0–34.0)
MCHC: 33.4 g/dL (ref 30.0–36.0)
MCV: 90.1 fL (ref 80.0–100.0)
Platelets: 368 10*3/uL (ref 150–400)
RBC: 3.82 MIL/uL — ABNORMAL LOW (ref 3.87–5.11)
RDW: 14.2 % (ref 11.5–15.5)
WBC: 15.3 10*3/uL — ABNORMAL HIGH (ref 4.0–10.5)
nRBC: 0 % (ref 0.0–0.2)

## 2019-09-09 LAB — URINALYSIS, COMPLETE (UACMP) WITH MICROSCOPIC
Bacteria, UA: NONE SEEN
Bilirubin Urine: NEGATIVE
Glucose, UA: NEGATIVE mg/dL
Ketones, ur: NEGATIVE mg/dL
Leukocytes,Ua: NEGATIVE
Nitrite: NEGATIVE
Protein, ur: NEGATIVE mg/dL
Specific Gravity, Urine: 1.026 (ref 1.005–1.030)
pH: 6 (ref 5.0–8.0)

## 2019-09-09 LAB — SARS CORONAVIRUS 2 (TAT 6-24 HRS): SARS Coronavirus 2: NEGATIVE

## 2019-09-09 LAB — MAGNESIUM: Magnesium: 2 mg/dL (ref 1.7–2.4)

## 2019-09-09 LAB — LIPASE, BLOOD: Lipase: 21 U/L (ref 11–51)

## 2019-09-09 MED ORDER — CIPROFLOXACIN IN D5W 400 MG/200ML IV SOLN
400.0000 mg | Freq: Once | INTRAVENOUS | Status: AC
  Administered 2019-09-09: 16:00:00 400 mg via INTRAVENOUS
  Filled 2019-09-09: qty 200

## 2019-09-09 MED ORDER — SODIUM CHLORIDE 0.9% FLUSH: 3.0000 mL | Freq: Once | INTRAVENOUS | Status: DC

## 2019-09-09 MED ORDER — SODIUM CHLORIDE 0.9 % IV BOLUS
1000.0000 mL | Freq: Once | INTRAVENOUS | Status: AC
  Administered 2019-09-09: 13:00:00 1000 mL via INTRAVENOUS

## 2019-09-09 MED ORDER — POTASSIUM CHLORIDE IN NACL 20-0.9 MEQ/L-% IV SOLN: Freq: Once | INTRAVENOUS | Status: DC

## 2019-09-09 MED ORDER — POTASSIUM CHLORIDE IN NACL 40-0.9 MEQ/L-% IV SOLN
INTRAVENOUS | Status: DC
  Administered 2019-09-09 – 2019-09-11 (×6): 100 mL/h via INTRAVENOUS
  Filled 2019-09-09 (×7): qty 1000

## 2019-09-09 MED ORDER — IOHEXOL 240 MG/ML SOLN: 25.0000 mL | INTRAMUSCULAR | Status: DC

## 2019-09-09 MED ORDER — IOHEXOL 300 MG/ML  SOLN
100.0000 mL | Freq: Once | INTRAMUSCULAR | Status: AC | PRN
  Administered 2019-09-09: 100 mL via INTRAVENOUS

## 2019-09-09 MED ORDER — SODIUM CHLORIDE 0.9 % IV BOLUS
1000.0000 mL | Freq: Once | INTRAVENOUS | Status: AC
  Administered 2019-09-09: 1000 mL via INTRAVENOUS

## 2019-09-09 MED ORDER — ONDANSETRON HCL 4 MG/2ML IJ SOLN
4.0000 mg | Freq: Once | INTRAMUSCULAR | Status: AC
  Administered 2019-09-09: 16:00:00 4 mg via INTRAVENOUS
  Filled 2019-09-09: qty 2

## 2019-09-09 MED ORDER — POTASSIUM CHLORIDE CRYS ER 20 MEQ PO TBCR
40.0000 meq | EXTENDED_RELEASE_TABLET | Freq: Once | ORAL | Status: AC
  Administered 2019-09-09: 13:00:00 40 meq via ORAL
  Filled 2019-09-09: qty 2

## 2019-09-09 MED ORDER — METRONIDAZOLE IN NACL 5-0.79 MG/ML-% IV SOLN
500.0000 mg | Freq: Once | INTRAVENOUS | Status: AC
  Administered 2019-09-09: 17:00:00 500 mg via INTRAVENOUS
  Filled 2019-09-09: qty 100

## 2019-09-09 MED ORDER — FENTANYL CITRATE (PF) 100 MCG/2ML IJ SOLN
50.0000 ug | Freq: Once | INTRAMUSCULAR | Status: AC
  Administered 2019-09-09: 16:00:00 50 ug via INTRAVENOUS
  Filled 2019-09-09: qty 2

## 2019-09-09 NOTE — ED Provider Notes (Addendum)
[facility] Emergency Department Provider Note  ____________________________________________   First MD Initiated Contact with Patient 09/09/19 1250     (approximate)  I have reviewed the triage vital signs and the nursing notes.  History  Chief Complaint Emesis    HPI Jodi Goodman is a 68 y.o. female who presents to the emergency department for diarrhea, abdominal cramping, weakness. Patient reports onset of diarrhea Sunday. She initially attributed it to some "bad food" she ate. However, she has had multiple episodes daily since then of large volume, loose, watery stool. She has associated generalized abdominal cramping. Moderate in severity, no alleviated/aggravating factors. Associated with nausea, no vomiting. No urinary symptoms. No fever. She denies any recent travel or antibiotic use.    Past Medical Hx Past Medical History:  Diagnosis Date  . Anemia    H/O  . Anxiety   . Arthritis   . Bruises easily   . Dysrhythmia    PALPITATIONS  . GERD (gastroesophageal reflux disease)   . Hyperlipidemia   . Hypertension     Problem List Patient Active Problem List   Diagnosis Date Noted  . 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 Chronic pain of left knee 02/11/2019  . Chronic pain syndrome 02/11/2019  . Primary osteoarthritis of left knee 05/08/2015    Past Surgical Hx Past Surgical History:  Procedure Laterality Date  . CESAREAN SECTION    . COLONOSCOPY    . EYE SURGERY Bilateral    laser to drain fluid  . KNEE ARTHROSCOPY WITH MENISCAL REPAIR Left 09/26/2015   Procedure: KNEE ARTHROSCOPY WITH partial lateral menisectomy and abrasion chondoplasty of patella.;  Surgeon: Corky Mull, MD;  Location: [facility] ORS;  Service: Orthopedics;  Laterality: Left;  . 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   Procedure: TOTAL KNEE ARTHROPLASTY;  Surgeon: Corky Mull, MD;  Location: [facility] ORS;  Service: Orthopedics;   Laterality: Left;  . WISDOM TOOTH EXTRACTION      Medications Prior to Admission medications   Medication Sig Start Date End Date Taking? Authorizing Provider  ALPRAZolam (XANAX) 0.25 MG tablet Take 0.25 mg by mouth 2 (two) times daily as needed for anxiety.    [provider]  apixaban (ELIQUIS) 2.5 MG TABS tablet Take 1 tablet (2.5 mg total) by mouth 2 (two) times daily. 08/12/19   Lattie Corns, PA-C  ASPERCREME LIDOCAINE EX Apply 1 application topically 3 (three) times daily as needed (pain.).    [provider]  calcium carbonate (TUMS EX) 750 MG chewable tablet Chew 1 tablet by mouth 3 (three) times daily as needed for heartburn.    [provider]  chlorthalidone (HYGROTON) 25 MG tablet Take 12.5 mg by mouth at bedtime.     [provider]  ezetimibe (ZETIA) 10 MG tablet Take 10 mg by mouth daily with lunch.    [provider]  famotidine (PEPCID) 20 MG tablet Take 20 mg by mouth 2 (two) times daily as needed for heartburn or indigestion.    [provider]  Multiple Vitamins-Minerals (ADULT GUMMY PO) Take 2 tablets by mouth daily with lunch. One-A-Day Women Gummy    [provider]  ondansetron (ZOFRAN) 4 MG tablet Take 1 tablet (4 mg total) by mouth every 6 (six) hours as needed for nausea. 08/12/19   Lattie Corns, PA-C  OVER THE COUNTER MEDICATION Apply 1 application topically 3 (three) times daily as needed (for pain.). Hempvana Pain Relief Cream  [provider]  oxyCODONE (OXY IR/ROXICODONE) 5 MG immediate release tablet Take 1-2 tablets (5-10 mg total) by mouth every 4 (four) hours as needed for moderate pain. 08/12/19   Anson Oregon, PA-C  Polyethyl Glycol-Propyl Glycol (SYSTANE) 0.4-0.3 % SOLN Place 1 drop into both eyes 3 (three) times daily as needed (dry/irritated eyes.).    [provider]  ramipril (ALTACE) 10 MG capsule Take 10 mg by mouth at bedtime.    [provider]  traMADol (ULTRAM) 50 MG tablet Take 1-2 tablets (50-100 mg total) by mouth every 6 (six) hours as needed for moderate pain. 08/12/19   Anson Oregon, PA-C    Allergies Latex, Penicillins, Aspirin, and Sulfa antibiotics  Family Hx No family history on file.  Social Hx Social History   Tobacco Use  . Smoking status: Never Smoker  . Smokeless tobacco: Never Used  Substance Use Topics  . Alcohol use: No  . Drug use: No     Review of Systems  Constitutional: Negative for fever, chills. Eyes: Negative for visual changes. ENT: Negative for sore throat. Cardiovascular: Negative for chest pain. Respiratory: Negative for shortness of breath. Gastrointestinal: + diarrhea, abdominal cramping Genitourinary: Negative for dysuria. Musculoskeletal: Negative for leg swelling. Skin: Negative for rash. Neurological: Negative for for headaches.   Physical Exam  Vital Signs: ED Triage Vitals  Enc Vitals Group     BP 09/09/19 1120 109/60     Pulse Rate 09/09/19 1120 99     Resp 09/09/19 1120 18     Temp 09/09/19 1122 99 F (37.2 C)     Temp Source 09/09/19 1122 Oral     SpO2 09/09/19 1120 99 %     Weight 09/09/19 1120 145 lb (65.8 kg)     Height 09/09/19 1120 5\' 4"  (1.626 m)     Head Circumference --      Peak Flow --      Pain Score 09/09/19 1124 0     Pain Loc --      Pain Edu? --      Excl. in GC? --     Constitutional: Alert and oriented. Fatigued.  Head: Normocephalic. Atraumatic. Eyes: Conjunctivae clear. Sclera anicteric. Nose: No congestion. No rhinorrhea. Mouth/Throat: Wearing mask. MM dry. Neck: No stridor.   Cardiovascular: Normal rate, regular rhythm. Extremities well perfused. Respiratory: Normal respiratory effort.  Lungs CTAB. Gastrointestinal: Soft. Mild generalized tenderness, no rebound or guarding. No distension.  Musculoskeletal: No lower extremity edema. No deformities. Neurologic:  Normal speech and language. No gross focal  neurologic deficits are appreciated.  Skin: Skin is warm, dry and intact. No rash noted. Psychiatric: Mood and affect are appropriate for situation.  EKG  Personally reviewed. 18:17 EKG.  Rate: 93 Rhythm: sinus Axis: normal Intervals: within normal limits No acute ischemic changes No STEMI    Radiology  CT: IMPRESSION:  Advanced colitis, particularly affecting the transverse and left  colon. Findings most likely infectious. Inflammatory bowel disease  could have a similar appearance. No evidence of perforation or  abscess.    Procedures  Procedure(s) performed (including critical care):  Procedures   Initial Impression / Assessment and Plan / ED Course  68 y.o. female who presents to the ED for diarrhea, abdominal cramping, as above.  Ddx: colitis, diverticulitis, gastroenteritis  Labs reveal hypokalemia, will replete. Normal magnesium. Leukocytosis. CT reveals advanced colitis. Given the volume and length of time of symptoms, will plan to treat with antibiotics and admit for rehydration,  correct of electrolytes. Patient agreeable w/ plan. Will send stool studies as well. Discussed with hospitalist.    Final Clinical Impression(s) / ED Diagnosis  Final diagnoses:  Dehydration  Hypokalemia  Diarrhea in adult patient  Colitis       Note:  This document was prepared using Dragon voice recognition software and may include unintentional dictation errors.   Miguel AschoffMonks,  L., MD 09/09/19 1747    Miguel AschoffMonks,  L., MD 09/18/19 [phone]

## 2019-09-09 NOTE — ED Notes (Signed)
Patient denies abdominal cramps at this time, but does c/o occasional left knee pain from recent replacement surgery.

## 2019-09-09 NOTE — ED Notes (Signed)
Report given to Gracie RN.

## 2019-09-09 NOTE — ED Triage Notes (Signed)
C/O diarrhea, vomiting and abdominal cramping since Sunday.

## 2019-09-09 NOTE — ED Notes (Signed)
Patient had loose BM in hat on commode that was contaminated with urine.

## 2019-09-09 NOTE — H&P (Addendum)
History and Physical:    Jodi Goodman   MPN:[id number] DOB: 07-Sep-1951 DOA: 09/09/2019  Referring MD/provider: Dr. Derrell Lolling PCP: Jodi Marble, MD   Patient coming from: Home  Chief Complaint: Diarrhea  History of Present Illness:   Jodi Goodman is an 68 y.o. female with osteoarthritis status post left knee total arthroplasty on 08/10/2019, hypertension, hyperlipidemia, GERD.  She presented to the hospital because of diarrhea and vomiting.  She said diarrhea started about 4 days ago.  Stools are watery and nonbloody.  She has lost count of bowel movement and she said sometimes she goes to the toilet every 2-3 hours.  She also has dry heaves and vomits when she eats.  She has not been able to keep any food down.  Symptoms associated with abdominal cramps.  She is feeling very dehydrated, weak and tired.  She thinks she had a fever about 2 days ago because her temperature was checked, it was around 101.  She has no other complaints.  She had a headache yesterday but that has resolved.  No dizziness, loss of consciousness, chest pain, shortness of breath, cough, wheezing.  She has not had any antibiotics recently.  ED Course: CT abdomen and pelvis showed acute colitis.  She was also hypokalemic with potassium of 2.7.  She was given Zofran, potassium chloride, IV fentanyl and IV antibiotics (Cipro and Flagyl (in the emergency room.  ROS:   ROS all other systems reviewed were negative  Past Medical History:   Past Medical History:  Diagnosis Date  . Anemia    H/O  . Anxiety   . Arthritis   . Bruises easily   . Dysrhythmia    PALPITATIONS  . GERD (gastroesophageal reflux disease)   . Hyperlipidemia   . Hypertension     Past Surgical History:   Past Surgical History:  Procedure Laterality Date  . CESAREAN SECTION    . COLONOSCOPY    . EYE SURGERY Bilateral    laser to drain fluid  . KNEE ARTHROSCOPY WITH MENISCAL REPAIR Left 09/26/2015   Procedure: KNEE  ARTHROSCOPY WITH partial lateral menisectomy and abrasion chondoplasty of patella.;  Surgeon: Corky Mull, MD;  Location: [facility] ORS;  Service: Orthopedics;  Laterality: Left;  . 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   Procedure: TOTAL KNEE ARTHROPLASTY;  Surgeon: Corky Mull, MD;  Location: [facility] ORS;  Service: Orthopedics;  Laterality: Left;  . WISDOM TOOTH EXTRACTION      Social History:   Social History   Socioeconomic History  . Marital status: Single    Spouse name: Not on file  . Number of children: Not on file  . Years of education: Not on file  . Highest education level: Not on file  Occupational History  . Not on file  Social Needs  . Financial resource strain: Not on file  . Food insecurity    Worry: Not on file    Inability: Not on file  . Transportation needs    Medical: Not on file    Non-medical: Not on file  Tobacco Use  . Smoking status: Never Smoker  . Smokeless tobacco: Never Used  Substance and Sexual Activity  . Alcohol use: No  . Drug use: No  . Sexual activity: Not on file  Lifestyle  . Physical activity    Days per week: Not on file    Minutes per session: Not on file  . Stress: Not on file  Relationships  . Social connections  Talks on phone: Not on file    Gets together: Not on file    Attends religious service: Not on file    Active member of club or organization: Not on file    Attends meetings of clubs or organizations: Not on file    Relationship status: Not on file  . Intimate partner violence    Fear of current or ex partner: Not on file    Emotionally abused: Not on file    Physically abused: Not on file    Forced sexual activity: Not on file  Other Topics Concern  . Not on file  Social History Narrative  . Not on file    Allergies   Latex, Penicillins, Tramadol, Aspirin, and Sulfa antibiotics  Family history:   History reviewed. No pertinent family history.  Current Medications:   Prior to Admission medications    Medication Sig Start Date End Date Taking? Authorizing Provider  ALPRAZolam (XANAX) 0.25 MG tablet Take 0.25 mg by mouth 2 (two) times daily as needed for anxiety.   Yes [provider]  chlorthalidone (HYGROTON) 25 MG tablet Take 12.5 mg by mouth at bedtime.    Yes [provider]  ezetimibe (ZETIA) 10 MG tablet Take 10 mg by mouth daily with lunch.   Yes [provider]  famotidine (PEPCID) 20 MG tablet Take 20 mg by mouth 2 (two) times daily as needed for heartburn or indigestion.   Yes [provider]  Multiple Vitamins-Minerals (ADULT GUMMY PO) Take 2 tablets by mouth daily with lunch. One-A-Day Women Gummy   Yes [provider]  ramipril (ALTACE) 10 MG capsule Take 10 mg by mouth at bedtime.   Yes [provider]  apixaban (ELIQUIS) 2.5 MG TABS tablet Take 1 tablet (2.5 mg total) by mouth 2 (two) times daily. Patient not taking: Reported on 09/09/2019 08/12/19   Anson OregonMcGhee, James Lance, PA-C  ASPERCREME LIDOCAINE EX Apply 1 application topically 3 (three) times daily as needed (pain.).    [provider]  calcium carbonate (TUMS EX) 750 MG chewable tablet Chew 1 tablet by mouth 3 (three) times daily as needed for heartburn.    [provider]  ondansetron (ZOFRAN) 4 MG tablet Take 1 tablet (4 mg total) by mouth every 6 (six) hours as needed for nausea. 08/12/19   Anson OregonMcGhee, James Lance, PA-C  OVER THE COUNTER MEDICATION Apply 1 application topically 3 (three) times daily as needed (for pain.). Hempvana Pain Relief Cream    [provider]  oxyCODONE (OXY IR/ROXICODONE) 5 MG immediate release tablet Take 1-2 tablets (5-10 mg total) by mouth every 4 (four) hours as needed for moderate pain. Patient not taking: Reported on 09/09/2019 08/12/19   Anson OregonMcGhee, James Lance, PA-C  Polyethyl Glycol-Propyl Glycol (SYSTANE) 0.4-0.3 % SOLN Place 1 drop into both eyes 3 (three) times daily as needed (dry/irritated eyes.).    [provider]  traMADol (ULTRAM) 50 MG tablet Take 1-2 tablets (50-100 mg total) by mouth every 6 (six) hours as needed for moderate pain. Patient not taking: Reported on 09/09/2019 08/12/19   Anson OregonMcGhee, James Lance, PA-C    Physical Exam:   Vitals:   09/09/19 1120 09/09/19 1122 09/09/19 1620  BP: 109/60  113/69  Pulse: 99  85  Resp: 18  18  Temp:  99 F (37.2 C)   TempSrc:  Oral   SpO2: 99%  98%  Weight: 65.8 kg    Height: 5\' 4"  (1.626 m)  Physical Exam: Blood pressure 113/69, pulse 85, temperature 99 F (37.2 C), temperature source Oral, resp. rate 18, height 5\' 4"  (1.626 m), weight 65.8 kg, SpO2 98 %. Gen: No acute distress. Head: Normocephalic, atraumatic. Eyes: Pupils equal, round and reactive to light. Extraocular movements intact.  Sclerae nonicteric. No lid lag. Mouth: Dry mouth, oropharynx without exudates or erythema. Dentition is good. Neck: Supple, no thyromegaly, no lymphadenopathy, no jugular venous distention. Chest: Lungs are clear to auscultation with good air movement. No rales, rhonchi or wheezes.  CV: Heart sounds are regular with an S1, S2. No murmurs, rubs, clicks, or gallops.  Abdomen: Soft, mild periumbilical tenderness without rebound tenderness or guarding, nondistended with normal active bowel sounds. No hepatosplenomegaly or palpable masses. Extremities: Extremities are without clubbing, or cyanosis. No leg or pedal edema. Pedal pulses 2+.  Left knee is mildly swollen and tender and there are Steri-Strips on the surgical wound. Skin: Warm and dry. No rashes, lesions or wounds. Neuro: Alert and oriented times 3; grossly nonfocal.  Psych: Insight is good and judgment is appropriate. Mood and affect normal.   Data Review:    Labs: Basic Metabolic Panel: Recent Labs  Lab 09/09/19 1127  NA 138  K 2.7*  CL 104  CO2 22  GLUCOSE 108*  BUN 12  CREATININE 0.78  CALCIUM 8.8*  MG 2.0   Liver Function Tests: Recent Labs  Lab 09/09/19 1127   AST 28  ALT 19  ALKPHOS 90  BILITOT 0.8  PROT 6.4*  ALBUMIN 3.1*   Recent Labs  Lab 09/09/19 1127  LIPASE 21   No results for input(s): AMMONIA in the last 168 hours. CBC: Recent Labs  Lab 09/09/19 1127  WBC 15.3*  HGB 11.5*  HCT 34.4*  MCV 90.1  PLT 368   Cardiac Enzymes: No results for input(s): CKTOTAL, CKMB, CKMBINDEX, TROPONINI in the last 168 hours.  BNP (last 3 results) No results for input(s): PROBNP in the last 8760 hours. CBG: No results for input(s): GLUCAP in the last 168 hours.  Urinalysis    Component Value Date/Time   COLORURINE YELLOW (A) 07/30/2019 1019   APPEARANCEUR HAZY (A) 07/30/2019 1019   APPEARANCEUR Clear 02/03/2014 1651   LABSPEC 1.017 07/30/2019 1019   LABSPEC 1.012 02/03/2014 1651   PHURINE 6.0 07/30/2019 1019   GLUCOSEU NEGATIVE 07/30/2019 1019   GLUCOSEU Negative 02/03/2014 1651   HGBUR SMALL (A) 07/30/2019 1019   BILIRUBINUR NEGATIVE 07/30/2019 1019   BILIRUBINUR Negative 02/03/2014 1651   KETONESUR NEGATIVE 07/30/2019 1019   PROTEINUR NEGATIVE 07/30/2019 1019   NITRITE NEGATIVE 07/30/2019 1019   LEUKOCYTESUR TRACE (A) 07/30/2019 1019   LEUKOCYTESUR Negative 02/03/2014 1651      Radiographic Studies: Ct Abdomen Pelvis W Contrast  Result Date: 09/09/2019 CLINICAL DATA:  Diarrhea.  Assess for colitis or diverticulitis. EXAM: CT ABDOMEN AND PELVIS WITH CONTRAST TECHNIQUE: Multidetector CT imaging of the abdomen and pelvis was performed using the standard protocol following bolus administration of intravenous contrast. CONTRAST:  09/11/2019 OMNIPAQUE IOHEXOL 300 MG/ML  SOLN COMPARISON:  Ultrasound 09/02/2008 FINDINGS: Lower chest: Normal Hepatobiliary: Normal Pancreas: Normal Spleen: Normal Adrenals/Urinary Tract: Adrenal glands are normal. Kidneys are normal. No cyst, mass, stone or hydronephrosis. Bladder is normal. Stomach/Bowel: Advanced colitis pattern throughout the colon, more severe on the left. Relative sparing of the cecum  and ascending colon. Infectious colitis would be most common. Inflammatory bowel disease not excluded. No sign of diverticulosis or diverticulitis. No perforation or abscess. Vascular/Lymphatic: Normal except for minimal  iliac artery atherosclerosis. Reproductive: Normal.  No pelvic mass. Other: No free fluid or air. Musculoskeletal: Mild lower lumbar degenerative changes. IMPRESSION: Advanced colitis, particularly affecting the transverse and left colon. Findings most likely infectious. Inflammatory bowel disease could have a similar appearance. No evidence of perforation or abscess. Electronically Signed   By: Paulina Fusi M.D.   On: 09/09/2019 14:12       Assessment/Plan:   Principal Problem:   Acute colitis Active Problems:   Hypokalemia   Dehydration   Body mass index is 24.89 kg/m.    Acute colitis with nausea, vomiting and diarrhea: Admit to MedSurg unit.  Treat IV Cipro and Flagyl, IV fluids and analgesics as needed for pain.  Stool for C. difficile toxin is pending.  Hypokalemia: Replete potassium intravenously.  She said she prefers intravenous to oral replacement because the pills are too big.  Dehydration: IV fluids for hydration.  Recent left knee arthroplasty: Patient said she takes enteric-coated aspirin 81 mg twice a day and she was told to continue prophylaxis for 2 more weeks.  However, while she is inpatient, will use Lovenox for DVT prophylaxis.  Hypertension: Hold antihypertensives today and resume tomorrow if BP is okay  Hyperlipidemia: Continue ezetimibe    Other information:   DVT prophylaxis: Lovenox ordered. Code Status: Full code. Family Communication: Plan discussed with the patient Disposition Plan: Home in 2 to 3 days Consults called: None Admission status: Inpatient   The medical decision making is of moderate complexity, therefore this is a level 2 visit.  Time spent 55 minutes  Da Michelle [facility] Pager [phone]   How to contact the [facility] Attending or Consulting provider 7A - 7P or covering provider during after hours 7P -7A, for this patient?   1. Check the care team in [facility] and look for a) attending/consulting [facility] provider listed and b) the [facility] team listed 2. Log into [email or web address] and use [place]'s universal password to access. If you do not have the password, please contact the hospital operator. 3. Locate the [facility] provider you are looking for under [facility] and page to a number that you can be directly reached. 4. If you still have difficulty reaching the provider, please page the [facility] (Director on Call) for the Hospitalists listed on amion for assistance.  09/09/2019, 4:44 PM

## 2019-09-09 NOTE — ED Notes (Signed)
Per EMS: pt had left total knee replacement 3 weeks ago, began having diarrhea 4 days ago, has been imodium with little relief. Vomited this am at 0130. Pt states fever the past 3 days, no fever with EMS. A&O. 500cc fluids thru 20g LFA initiated by EMS. Pt did have loose stool on herself on the way to ED, BG 107. Cleaned up in triage room by Dorian, [place].

## 2019-09-09 NOTE — ED Notes (Signed)
Hospitalist informed of c diff positive.

## 2019-09-10 LAB — CBC
HCT: 30.8 % — ABNORMAL LOW (ref 36.0–46.0)
Hemoglobin: 9.9 g/dL — ABNORMAL LOW (ref 12.0–15.0)
MCH: 30 pg (ref 26.0–34.0)
MCHC: 32.1 g/dL (ref 30.0–36.0)
MCV: 93.3 fL (ref 80.0–100.0)
Platelets: 297 10*3/uL (ref 150–400)
RBC: 3.3 MIL/uL — ABNORMAL LOW (ref 3.87–5.11)
RDW: 14.5 % (ref 11.5–15.5)
WBC: 10.1 10*3/uL (ref 4.0–10.5)
nRBC: 0 % (ref 0.0–0.2)

## 2019-09-10 LAB — BASIC METABOLIC PANEL
Anion gap: 7 (ref 5–15)
BUN: 8 mg/dL (ref 8–23)
CO2: 21 mmol/L — ABNORMAL LOW (ref 22–32)
Calcium: 8.3 mg/dL — ABNORMAL LOW (ref 8.9–10.3)
Chloride: 113 mmol/L — ABNORMAL HIGH (ref 98–111)
Creatinine, Ser: 0.53 mg/dL (ref 0.44–1.00)
GFR calc Af Amer: >60 mL/min (ref >60)
GFR calc non Af Amer: >60 mL/min (ref >60)
Glucose, Bld: 103 mg/dL — ABNORMAL HIGH (ref 70–99)
Potassium: 3.3 mmol/L — ABNORMAL LOW (ref 3.5–5.1)
Sodium: 141 mmol/L (ref 135–145)

## 2019-09-10 LAB — MAGNESIUM: Magnesium: 1.9 mg/dL (ref 1.7–2.4)

## 2019-09-10 LAB — HIV ANTIBODY (ROUTINE TESTING W REFLEX): HIV Screen 4th Generation wRfx: NONREACTIVE

## 2019-09-10 MED ORDER — CIPROFLOXACIN IN D5W 400 MG/200ML IV SOLN: 400.0000 mg | Freq: Two times a day (BID) | INTRAVENOUS | Status: DC

## 2019-09-10 MED ORDER — RAMIPRIL 10 MG PO CAPS
10.0000 mg | ORAL_CAPSULE | Freq: Every day | ORAL | Status: DC
  Administered 2019-09-10 – 2019-09-11 (×2): 10 mg via ORAL
  Filled 2019-09-10 (×5): qty 1

## 2019-09-10 MED ORDER — ACETAMINOPHEN 650 MG RE SUPP: 650.0000 mg | Freq: Four times a day (QID) | RECTAL | Status: DC | PRN

## 2019-09-10 MED ORDER — ALPRAZOLAM 0.25 MG PO TABS
0.2500 mg | ORAL_TABLET | Freq: Two times a day (BID) | ORAL | Status: DC | PRN
  Administered 2019-09-11: 08:00:00 0.25 mg via ORAL
  Filled 2019-09-10: qty 1

## 2019-09-10 MED ORDER — ONDANSETRON HCL 4 MG PO TABS: 4.0000 mg | ORAL_TABLET | Freq: Four times a day (QID) | ORAL | Status: DC | PRN

## 2019-09-10 MED ORDER — METRONIDAZOLE IN NACL 5-0.79 MG/ML-% IV SOLN: 500.0000 mg | Freq: Three times a day (TID) | INTRAVENOUS | Status: DC

## 2019-09-10 MED ORDER — ONDANSETRON HCL 4 MG/2ML IJ SOLN: 4.0000 mg | Freq: Four times a day (QID) | INTRAMUSCULAR | Status: DC | PRN

## 2019-09-10 MED ORDER — EZETIMIBE 10 MG PO TABS
10.0000 mg | ORAL_TABLET | Freq: Every day | ORAL | Status: DC
  Administered 2019-09-11 – 2019-09-12 (×2): 10 mg via ORAL
  Filled 2019-09-10 (×3): qty 1

## 2019-09-10 MED ORDER — CIPROFLOXACIN IN D5W 400 MG/200ML IV SOLN
400.0000 mg | Freq: Two times a day (BID) | INTRAVENOUS | Status: DC
  Administered 2019-09-10: 05:00:00 400 mg via INTRAVENOUS
  Filled 2019-09-10: qty 200

## 2019-09-10 MED ORDER — POTASSIUM CHLORIDE CRYS ER 10 MEQ PO TBCR
30.0000 meq | EXTENDED_RELEASE_TABLET | Freq: Once | ORAL | Status: AC
  Administered 2019-09-10: 13:00:00 30 meq via ORAL
  Filled 2019-09-10: qty 3

## 2019-09-10 MED ORDER — POTASSIUM CHLORIDE CRYS ER 10 MEQ PO TBCR
40.0000 meq | EXTENDED_RELEASE_TABLET | Freq: Once | ORAL | Status: AC
  Administered 2019-09-10: 10 meq via ORAL
  Filled 2019-09-10: qty 2

## 2019-09-10 MED ORDER — VANCOMYCIN 50 MG/ML ORAL SOLUTION
125.0000 mg | Freq: Four times a day (QID) | ORAL | Status: DC
  Administered 2019-09-10 – 2019-09-13 (×13): 125 mg via ORAL
  Filled 2019-09-10 (×18): qty 2.5

## 2019-09-10 MED ORDER — ENOXAPARIN SODIUM 40 MG/0.4ML ~~LOC~~ SOLN
40.0000 mg | SUBCUTANEOUS | Status: DC
  Administered 2019-09-10 – 2019-09-13 (×4): 40 mg via SUBCUTANEOUS
  Filled 2019-09-10 (×4): qty 0.4

## 2019-09-10 MED ORDER — POLYVINYL ALCOHOL 1.4 % OP SOLN
1.0000 [drp] | Freq: Three times a day (TID) | OPHTHALMIC | Status: DC | PRN
  Filled 2019-09-10: qty 15

## 2019-09-10 MED ORDER — METRONIDAZOLE IN NACL 5-0.79 MG/ML-% IV SOLN
500.0000 mg | Freq: Three times a day (TID) | INTRAVENOUS | Status: DC
  Administered 2019-09-10 – 2019-09-13 (×11): 500 mg via INTRAVENOUS
  Filled 2019-09-10 (×14): qty 100

## 2019-09-10 MED ORDER — CALCIUM CARBONATE ANTACID 500 MG PO CHEW: 200.0000 mg | CHEWABLE_TABLET | Freq: Three times a day (TID) | ORAL | Status: DC | PRN

## 2019-09-10 MED ORDER — FAMOTIDINE 20 MG PO TABS
20.0000 mg | ORAL_TABLET | Freq: Two times a day (BID) | ORAL | Status: DC | PRN
  Filled 2019-09-10: qty 1

## 2019-09-10 MED ORDER — CHLORTHALIDONE 25 MG PO TABS
12.5000 mg | ORAL_TABLET | Freq: Every day | ORAL | Status: DC
  Administered 2019-09-10 – 2019-09-11 (×2): 12.5 mg via ORAL
  Filled 2019-09-10 (×5): qty 0.5

## 2019-09-10 MED ORDER — ACETAMINOPHEN 325 MG PO TABS
650.0000 mg | ORAL_TABLET | Freq: Four times a day (QID) | ORAL | Status: DC | PRN
  Administered 2019-09-10: 08:00:00 325 mg via ORAL
  Administered 2019-09-11: 10:00:00 650 mg via ORAL
  Filled 2019-09-10 (×2): qty 2

## 2019-09-10 NOTE — ED Notes (Signed)
Pt pressed call button. Reports concern that R hand is swelling. No obvious swelling noted at this time. Pt concerned that something is wrong with her IV. Pt's IV tubing kinked, straightened and secured by Medic, Jonni Sanger. Pt still concerned about IV. Medic Jonni Sanger offered new PIV placement and pt stated, "Do you see how many times I've been stuck!" and demonstrated single 2x2 dressing to LFA. No signs of infiltration to IV site at this time. IV pump running appropriately after tubing straightened. Pt instructed to call staff if any new concerns.

## 2019-09-10 NOTE — ED Notes (Signed)
Pt given clear liquid breakfast tray. Asking for applesauce. Explained she is on clear liquid diet and cannot have applesauce on that diet.

## 2019-09-10 NOTE — ED Notes (Signed)
Pt given warm blankets and lights turned off for pt comfort at this time

## 2019-09-10 NOTE — ED Notes (Signed)
Meal tray given to pt. Pt upset that tray did not include spoon for jello. Spoon obtained and given to pt. Nurse warmed pt's beef broth before bringing it to pt's room as she was also upset about her broth being cold this morning. Pt requesting more comfortable bed. Will ask secretary to see if any extra hospital beds are available at this time.

## 2019-09-10 NOTE — ED Notes (Signed)
Pt wanting broth heated and fresh coffee.  Broth was heated and returned to patient.  New coffee given to patient as well.  Pt not wanting to take the potassium pills because "they are too big". Informed pt can see if she can try the powder and pt states "I can't drink that".  Offered to talk with pharmacy about getting the 10 meq potassium pills instead and see if they are easier to take; after speaking with pharmacist these will be sent and given to equal her 40 meq due.  Pt also not happy that grape juice was sent on tray and ginger ale instead of diet ginger ale.  Pt has orange juice at bedside and was given diet ginger ale.

## 2019-09-10 NOTE — ED Notes (Signed)
Received x3 41mEq potassium tablets from pharmacy d/t pt not able to swallow larger pills. Per report pt was able to swallow half of one large 89mEq tablet and still needs another 67mEq. Pt upset that she now has to take 3 pills instead of 2. Explained that breaking the large tablets in half would have resulted in 3 additional halves anyway and pt refused to try liquid form. Pt states she has too many medications ordered for her to take on empty stomach (pt on liquid diet for bowel rest, ate 50% on morning tray), refused zetia. Offered for pt to take potassium tablets one at a time over 15-20 minutes to see how they affect her and then will bring pt's oral vanc when meal trays arrive. Pt agreeable to plan.

## 2019-09-10 NOTE — Progress Notes (Signed)
PROGRESS NOTE  Jodi Goodman GQQ:[id number] DOB: 02-26-1951 DOA: 09/09/2019 PCP: Jodi Marble, MD  Brief History   Jodi Goodman is an 68 y.o. female with osteoarthritis status post left knee total arthroplasty on 08/10/2019, hypertension, hyperlipidemia, GERD.  She presented to the hospital because of diarrhea and vomiting.  She said diarrhea started about 4 days ago.  Stools are watery and nonbloody.  She has lost count of bowel movement and she said sometimes she goes to the toilet every 2-3 hours.  She also has dry heaves and vomits when she eats.  She has not been able to keep any food down.  Symptoms associated with abdominal cramps.  She is feeling very dehydrated, weak and tired.  She thinks she had a fever about 2 days ago because her temperature was checked, it was around 101.  She has no other complaints.  She had a headache yesterday but that has resolved.  No dizziness, loss of consciousness, chest pain, shortness of breath, cough, wheezing.  She has not had any antibiotics recently.  ED Course: CT abdomen and pelvis showed acute colitis.  She was also hypokalemic with potassium of 2.7.  She was given Zofran, potassium chloride, IV fentanyl and IV antibiotics (Cipro and Flagyl (in the emergency room. The patient's stool was tested and was positive for C Diff. She has been placed on enteric precautions. IV Cipro has been started, and the patient has been started on oral vancomycin.   Consultants  . None  Procedures  . None  Antibiotics   Anti-infectives (From admission, onward)   Start     Dose/Rate Route Frequency Ordered Stop   09/10/19 1000  vancomycin (VANCOCIN) 50 mg/mL oral solution 125 mg     125 mg Oral 4 times daily 09/10/19 0759 09/20/19 0959   09/10/19 0600  ciprofloxacin (CIPRO) IVPB 400 mg  Status:  Discontinued     400 mg 200 mL/hr over 60 Minutes Intravenous Every 12 hours 09/10/19 0212 09/10/19 0759   09/10/19 0400  ciprofloxacin (CIPRO) IVPB 400 mg   Status:  Discontinued     400 mg 200 mL/hr over 60 Minutes Intravenous Every 12 hours 09/10/19 0108 09/10/19 0759   09/10/19 0215  metroNIDAZOLE (FLAGYL) IVPB 500 mg  Status:  Discontinued     500 mg 100 mL/hr over 60 Minutes Intravenous Every 8 hours 09/10/19 0212 09/10/19 0759   09/10/19 0115  metroNIDAZOLE (FLAGYL) IVPB 500 mg     500 mg 100 mL/hr over 60 Minutes Intravenous Every 8 hours 09/10/19 0108     09/09/19 1500  ciprofloxacin (CIPRO) IVPB 400 mg     400 mg 200 mL/hr over 60 Minutes Intravenous  Once 09/09/19 1457 09/09/19 1654   09/09/19 1500  metroNIDAZOLE (FLAGYL) IVPB 500 mg     500 mg 100 mL/hr over 60 Minutes Intravenous  Once 09/09/19 1458 09/09/19 1813    .  Subjective  The patient is sitting up on bedside commode. She is very unhappy about the condition of her area in the ED and requests housekeeping to come and remove trash and clean the floors.  Objective   Vitals:  Vitals:   09/10/19 1500 09/10/19 1601  BP: 123/77 113/88  Pulse: 84 83  Resp: 18 16  Temp:  98 F (36.7 C)  SpO2: 99% 100%   Exam:  Constitutional:  The patient is awake, alert, and oriented x 3. She is very upset. Respiratory:  No increased work of breathing. No wheezes, rales, or rhonchi  No tactile fremitus Cardiovascular:  Regular rate and rhythm No murmurs, ectopy, or gallups. No lateral PMI. No thrills. Abdomen:  Abdomen is soft, non-tender, non-distended No hernias, masses, or organomegaly Normoactive bowel sounds.  Musculoskeletal:  No cyanosis, clubbing, or edema Skin:  No rashes, lesions, ulcers palpation of skin: no induration or nodules Neurologic:  CN 2-12 intact Sensation all 4 extremities intact Psychiatric:  Mental status Orientation to person, place, time  Patient appears angry and depressed judgment and insight appear intact  I have personally reviewed the following:   Today's Data  . Vitals, BMP, CBC  Micro Data  . C Diff positive  Imaging  . CT  abdomen and pelvis: Advanced colitis affecting transverse and left colon. No evidence of perforation of abscess.  Scheduled Meds: . chlorthalidone  12.5 mg Oral QHS  . enoxaparin (LOVENOX) injection  40 mg Subcutaneous Q24H  . ezetimibe  10 mg Oral Q lunch  . ramipril  10 mg Oral QHS  . sodium chloride flush  3 mL Intravenous Once  . vancomycin  125 mg Oral QID   Continuous Infusions: . 0.9 % NaCl with KCl 40 mEq / L 100 mL/hr (09/10/19 1353)  . metronidazole Stopped (09/10/19 1102)    Principal Problem:   Acute colitis Active Problems:   Hypokalemia   Dehydration   LOS: 1 day   A & P   Acute C Diff colitis with nausea, vomiting and diarrhea: IV Cipro has been discontinued and oral vancomycin has been started. Enteric precautions.  Hypokalemia: Supplement and monitor.  Dehydration: IV fluids for hydration.  Recent left knee arthroplasty: Patient said she takes enteric-coated aspirin 81 mg twice a day and she was told to continue prophylaxis for 2 more weeks.  However, while she is inpatient, will use Lovenox for DVT prophylaxis.  Hypertension: Blood pressure is normotensive. Antihypertensives have been held.  Hyperlipidemia: Continue ezetimibe  I have seen and examined this patient myself. I have spent 32 minutes in her evaluation and care.  DVT prophylaxis: Lovenox ordered. Code Status: Full code. Family Communication: Plan discussed with the patient Disposition Plan: Home   Toa Mia, DO [facility] Direct contact: see [email or web address]  7PM-7AM contact night coverage as above 09/10/2019, 4:56 PM  LOS: 1 day

## 2019-09-10 NOTE — ED Notes (Signed)
Called Kristen in pharmacy and she reports other 30 meq Potassium should be coming.

## 2019-09-10 NOTE — ED Notes (Signed)
Brought pt a pillow, and assisted pt to reposition in bed.

## 2019-09-10 NOTE — ED Notes (Addendum)
Pt assisted to the restroom at this time. Picked up trash off the floor. Pt stated that her room has not been cleaned in two days. This tech acknowledge this and requested EVS to clean room. Secretary on B side was also informed of this and she called EVS too.

## 2019-09-10 NOTE — ED Notes (Signed)
Orthopedic MD at bedside to assess pt's L knee replacement.

## 2019-09-11 LAB — CBC
HCT: 31.7 % — ABNORMAL LOW (ref 36.0–46.0)
Hemoglobin: 10.7 g/dL — ABNORMAL LOW (ref 12.0–15.0)
MCH: 29.8 pg (ref 26.0–34.0)
MCHC: 33.8 g/dL (ref 30.0–36.0)
MCV: 88.3 fL (ref 80.0–100.0)
Platelets: 324 10*3/uL (ref 150–400)
RBC: 3.59 MIL/uL — ABNORMAL LOW (ref 3.87–5.11)
RDW: 14.4 % (ref 11.5–15.5)
WBC: 11.1 10*3/uL — ABNORMAL HIGH (ref 4.0–10.5)
nRBC: 0 % (ref 0.0–0.2)

## 2019-09-11 LAB — COMPREHENSIVE METABOLIC PANEL
ALT: 17 U/L (ref 0–44)
AST: 18 U/L (ref 15–41)
Albumin: 2.9 g/dL — ABNORMAL LOW (ref 3.5–5.0)
Alkaline Phosphatase: 77 U/L (ref 38–126)
Anion gap: 10 (ref 5–15)
BUN: <5 mg/dL — ABNORMAL LOW (ref 8–23)
CO2: 19 mmol/L — ABNORMAL LOW (ref 22–32)
Calcium: 8.6 mg/dL — ABNORMAL LOW (ref 8.9–10.3)
Chloride: 109 mmol/L (ref 98–111)
Creatinine, Ser: 0.52 mg/dL (ref 0.44–1.00)
GFR calc Af Amer: >60 mL/min (ref >60)
GFR calc non Af Amer: >60 mL/min (ref >60)
Glucose, Bld: 95 mg/dL (ref 70–99)
Potassium: 3.6 mmol/L (ref 3.5–5.1)
Sodium: 138 mmol/L (ref 135–145)
Total Bilirubin: 0.6 mg/dL (ref 0.3–1.2)
Total Protein: 5.8 g/dL — ABNORMAL LOW (ref 6.5–8.1)

## 2019-09-11 NOTE — Progress Notes (Signed)
PROGRESS NOTE  Jodi Goodman QIH:[id number] DOB: Aug 24, 1951 DOA: 09/09/2019 PCP: Jodi Marble, MD  Brief History   Jodi Goodman is an 68 y.o. female with osteoarthritis status post left knee total arthroplasty on 08/10/2019, hypertension, hyperlipidemia, GERD.  She presented to the hospital because of diarrhea and vomiting.  She said diarrhea started about 4 days ago.  Stools are watery and nonbloody.  She has lost count of bowel movement and she said sometimes she goes to the toilet every 2-3 hours.  She also has dry heaves and vomits when she eats.  She has not been able to keep any food down.  Symptoms associated with abdominal cramps.  She is feeling very dehydrated, weak and tired.  She thinks she had a fever about 2 days ago because her temperature was checked, it was around 101.  She has no other complaints.  She had a headache yesterday but that has resolved.  No dizziness, loss of consciousness, chest pain, shortness of breath, cough, wheezing.  She has not had any antibiotics recently.  ED Course: CT abdomen and pelvis showed acute colitis.  She was also hypokalemic with potassium of 2.7.  She was given Zofran, potassium chloride, IV fentanyl and IV antibiotics (Cipro and Flagyl (in the emergency room. The patient's stool was tested and was positive for C Diff. She has been placed on enteric precautions. IV Cipro has been started, and the patient has been started on oral vancomycin.   Consultants  . None  Procedures  . None  Antibiotics   Anti-infectives (From admission, onward)   Start     Dose/Rate Route Frequency Ordered Stop   09/10/19 1000  vancomycin (VANCOCIN) 50 mg/mL oral solution 125 mg     125 mg Oral 4 times daily 09/10/19 0759 09/20/19 0959   09/10/19 0600  ciprofloxacin (CIPRO) IVPB 400 mg  Status:  Discontinued     400 mg 200 mL/hr over 60 Minutes Intravenous Every 12 hours 09/10/19 0212 09/10/19 0759   09/10/19 0400  ciprofloxacin (CIPRO) IVPB 400 mg   Status:  Discontinued     400 mg 200 mL/hr over 60 Minutes Intravenous Every 12 hours 09/10/19 0108 09/10/19 0759   09/10/19 0215  metroNIDAZOLE (FLAGYL) IVPB 500 mg  Status:  Discontinued     500 mg 100 mL/hr over 60 Minutes Intravenous Every 8 hours 09/10/19 0212 09/10/19 0759   09/10/19 0115  metroNIDAZOLE (FLAGYL) IVPB 500 mg     500 mg 100 mL/hr over 60 Minutes Intravenous Every 8 hours 09/10/19 0108     09/09/19 1500  ciprofloxacin (CIPRO) IVPB 400 mg     400 mg 200 mL/hr over 60 Minutes Intravenous  Once 09/09/19 1457 09/09/19 1654   09/09/19 1500  metroNIDAZOLE (FLAGYL) IVPB 500 mg     500 mg 100 mL/hr over 60 Minutes Intravenous  Once 09/09/19 1458 09/09/19 1813     Subjective  The patient is sitting up in bed this morning. She is in better spirits. She states that she is feeling better. She had two solid stools last night and one loose one this morning. She states that taking any PO makes her abdomen bloat, and causes her to cramp, and feel nauseated.   Objective   Vitals:  Vitals:   09/11/19 0134 09/11/19 0732  BP: 113/64 117/72  Pulse: 86 77  Resp: 18   Temp: 98.6 F (37 C) 99 F (37.2 C)  SpO2: 100% 98%   Exam:  Constitutional:  The patient is  awake, alert, and oriented x 3. No acute distress. Respiratory:  No increased work of breathing. No wheezes, rales, or rhonchi No tactile fremitus Cardiovascular:  Regular rate and rhythm No murmurs, ectopy, or gallups. No lateral PMI. No thrills. Abdomen:  Abdomen is soft, non-tender, a little distended. No hernias, masses, or organomegaly Normoactive bowel sounds.  Musculoskeletal:  No cyanosis, clubbing, or edema Skin:  No rashes, lesions, ulcers palpation of skin: no induration or nodules Neurologic:  CN 2-12 intact Sensation all 4 extremities intact Psychiatric:  Mental status Orientation to person, place, time  Patient appears angry and depressed judgment and insight appear intact  I have  personally reviewed the following:   Today's Data  . Vitals, BMP, CBC  Micro Data  . C Diff positive  Imaging  . CT abdomen and pelvis: Advanced colitis affecting transverse and left colon. No evidence of perforation of abscess.  Scheduled Meds: . chlorthalidone  12.5 mg Oral QHS  . enoxaparin (LOVENOX) injection  40 mg Subcutaneous Q24H  . ezetimibe  10 mg Oral Q lunch  . ramipril  10 mg Oral QHS  . sodium chloride flush  3 mL Intravenous Once  . vancomycin  125 mg Oral QID   Continuous Infusions: . 0.9 % NaCl with KCl 40 mEq / L 100 mL/hr (09/11/19 0927)  . metronidazole 500 mg (09/11/19 0931)    Principal Problem:   Acute colitis Active Problems:   Hypokalemia   Dehydration   LOS: 2 days   A & P   Acute C Diff colitis with nausea, vomiting and diarrhea: IV Cipro has been discontinued and oral vancomycin has been started. Enteric precautions. Patien tis improving, but she isn't tolerating her clear liquid diet well currently.  Hypokalemia: Supplement and monitor.  Dehydration: IV fluids for hydration.  Recent left knee arthroplasty: Patient said she takes enteric-coated aspirin 81 mg twice a day and she was told to continue prophylaxis for 2 more weeks.  However, while she is inpatient, will use Lovenox for DVT prophylaxis.  Hypertension: Blood pressure is normotensive. Antihypertensives have been held.  Hyperlipidemia: Continue ezetimibe  I have seen and examined this patient myself. I have spent 35 minutes in her evaluation and care.  DVT prophylaxis: Lovenox ordered. Code Status: Full code. Family Communication: None at bedside Disposition Plan: Home   Anjelique Makar, DO [facility] Direct contact: see [email or web address]  7PM-7AM contact night coverage as above 09/11/2019, 12:42 PM  LOS: 1 day

## 2019-09-11 NOTE — Plan of Care (Signed)

## 2019-09-12 LAB — BASIC METABOLIC PANEL
Anion gap: 8 (ref 5–15)
BUN: <5 mg/dL — ABNORMAL LOW (ref 8–23)
CO2: 22 mmol/L (ref 22–32)
Calcium: 8.9 mg/dL (ref 8.9–10.3)
Chloride: 111 mmol/L (ref 98–111)
Creatinine, Ser: 0.56 mg/dL (ref 0.44–1.00)
GFR calc Af Amer: >60 mL/min (ref >60)
GFR calc non Af Amer: >60 mL/min (ref >60)
Glucose, Bld: 96 mg/dL (ref 70–99)
Potassium: 3.7 mmol/L (ref 3.5–5.1)
Sodium: 141 mmol/L (ref 135–145)

## 2019-09-12 MED ORDER — ALUM & MAG HYDROXIDE-SIMETH 200-200-20 MG/5ML PO SUSP
30.0000 mL | ORAL | Status: DC | PRN
  Administered 2019-09-12: 23:00:00 30 mL via ORAL
  Filled 2019-09-12: qty 30

## 2019-09-12 NOTE — Progress Notes (Signed)
PROGRESS NOTE  Jodi Goodman QPY:[id number] DOB: 08-13-1951 DOA: 09/09/2019 PCP: Sherron Monday, MD  Brief History   Jodi Goodman is an 68 y.o. female with osteoarthritis status post left knee total arthroplasty on 08/10/2019, hypertension, hyperlipidemia, GERD.  She presented to the hospital because of diarrhea and vomiting.  She said diarrhea started about 4 days ago.  Stools are watery and nonbloody.  She has lost count of bowel movement and she said sometimes she goes to the toilet every 2-3 hours.  She also has dry heaves and vomits when she eats.  She has not been able to keep any food down.  Symptoms associated with abdominal cramps.  She is feeling very dehydrated, weak and tired.  She thinks she had a fever about 2 days ago because her temperature was checked, it was around 101.  She has no other complaints.  She had a headache yesterday but that has resolved.  No dizziness, loss of consciousness, chest pain, shortness of breath, cough, wheezing.  She has not had any antibiotics recently.  ED Course: CT abdomen and pelvis showed acute colitis.  She was also hypokalemic with potassium of 2.7.  She was given Zofran, potassium chloride, IV fentanyl and IV antibiotics (Cipro and Flagyl (in the emergency room. The patient's stool was tested and was positive for C Diff. She has been placed on enteric precautions. IV Cipro has been started, and the patient has been started on oral vancomycin.   Consultants  . None  Procedures  . None  Antibiotics   Anti-infectives (From admission, onward)   Start     Dose/Rate Route Frequency Ordered Stop   09/10/19 1000  vancomycin (VANCOCIN) 50 mg/mL oral solution 125 mg     125 mg Oral 4 times daily 09/10/19 0759 09/20/19 0959   09/10/19 0600  ciprofloxacin (CIPRO) IVPB 400 mg  Status:  Discontinued     400 mg 200 mL/hr over 60 Minutes Intravenous Every 12 hours 09/10/19 0212 09/10/19 0759   09/10/19 0400  ciprofloxacin (CIPRO) IVPB 400 mg   Status:  Discontinued     400 mg 200 mL/hr over 60 Minutes Intravenous Every 12 hours 09/10/19 0108 09/10/19 0759   09/10/19 0215  metroNIDAZOLE (FLAGYL) IVPB 500 mg  Status:  Discontinued     500 mg 100 mL/hr over 60 Minutes Intravenous Every 8 hours 09/10/19 0212 09/10/19 0759   09/10/19 0115  metroNIDAZOLE (FLAGYL) IVPB 500 mg     500 mg 100 mL/hr over 60 Minutes Intravenous Every 8 hours 09/10/19 0108     09/09/19 1500  ciprofloxacin (CIPRO) IVPB 400 mg     400 mg 200 mL/hr over 60 Minutes Intravenous  Once 09/09/19 1457 09/09/19 1654   09/09/19 1500  metroNIDAZOLE (FLAGYL) IVPB 500 mg     500 mg 100 mL/hr over 60 Minutes Intravenous  Once 09/09/19 1458 09/09/19 1813     Subjective  The patient is sitting up in bed this morning. She continues to have difficulty with abdominal cramping and pain. She is willing to try to advance diet to full liquids.  Objective   Vitals:  Vitals:   09/11/19 2351 09/12/19 0720  BP: 127/79 125/78  Pulse: 65 65  Resp: 17   Temp: 98.5 F (36.9 C) 98.6 F (37 C)  SpO2: 100% 100%   Exam:  Constitutional:  The patient is awake, alert, and oriented x 3. No acute distress. Respiratory:  No increased work of breathing. No wheezes, rales, or rhonchi No tactile  fremitus Cardiovascular:  Regular rate and rhythm No murmurs, ectopy, or gallups. No lateral PMI. No thrills. Abdomen:  Abdomen is soft, non-tender, a little distended. No hernias, masses, or organomegaly Normoactive bowel sounds.  Musculoskeletal:  No cyanosis, clubbing, or edema Skin:  No rashes, lesions, ulcers palpation of skin: no induration or nodules Neurologic:  CN 2-12 intact Sensation all 4 extremities intact Psychiatric:  Mental status Orientation to person, place, time  Patient appears angry and depressed judgment and insight appear intact  I have personally reviewed the following:   Today's Data  . Vitals, BMP, CBC  Micro Data  . C Diff positive   Imaging  . CT abdomen and pelvis: Advanced colitis affecting transverse and left colon. No evidence of perforation of abscess.  Scheduled Meds: . chlorthalidone  12.5 mg Oral QHS  . enoxaparin (LOVENOX) injection  40 mg Subcutaneous Q24H  . ezetimibe  10 mg Oral Q lunch  . ramipril  10 mg Oral QHS  . sodium chloride flush  3 mL Intravenous Once  . vancomycin  125 mg Oral QID   Continuous Infusions: . metronidazole 500 mg (09/12/19 1052)    Principal Problem:   Acute colitis Active Problems:   Hypokalemia   Dehydration   LOS: 3 days   A & P   Acute C Diff colitis with nausea, vomiting and diarrhea: IV Cipro has been discontinued and oral vancomycin has been started. Enteric precautions. Patient continues to improve. We will advance her diet to full liquids.  Hypokalemia: Resolved. Monitor.  Dehydration: Resolved.  Recent left knee arthroplasty: Patient said she takes enteric-coated aspirin 81 mg twice a day and she was told to continue prophylaxis for 2 more weeks.  However, while she is inpatient, will use Lovenox for DVT prophylaxis.  Hypertension: Blood pressure is normotensive. Antihypertensives have been held.  Hyperlipidemia: Continue ezetimibe  I have seen and examined this patient myself. I have spent 32 minutes in her evaluation and care.  DVT prophylaxis: Lovenox ordered. Code Status: Full code. Family Communication: None at bedside Disposition Plan: Home   Wafaa Deemer, DO [facility] Direct contact: see [email or web address]  7PM-7AM contact night coverage as above 09/12/2019, 11:55 PM  LOS: 1 day

## 2019-09-13 LAB — BASIC METABOLIC PANEL WITH GFR
Anion gap: 12 (ref 5–15)
BUN: <5 mg/dL — ABNORMAL LOW (ref 8–23)
CO2: 22 mmol/L (ref 22–32)
Calcium: 9.1 mg/dL (ref 8.9–10.3)
Chloride: 106 mmol/L (ref 98–111)
Creatinine, Ser: 0.64 mg/dL (ref 0.44–1.00)
GFR calc Af Amer: >60 mL/min
GFR calc non Af Amer: >60 mL/min
Glucose, Bld: 103 mg/dL — ABNORMAL HIGH (ref 70–99)
Potassium: 3 mmol/L — ABNORMAL LOW (ref 3.5–5.1)
Sodium: 140 mmol/L (ref 135–145)

## 2019-09-13 MED ORDER — POTASSIUM CHLORIDE CRYS ER 20 MEQ PO TBCR
40.0000 meq | EXTENDED_RELEASE_TABLET | Freq: Once | ORAL | Status: AC
  Administered 2019-09-13: 40 meq via ORAL
  Filled 2019-09-13: qty 2

## 2019-09-13 MED ORDER — SODIUM CHLORIDE 0.9 % IV SOLN
INTRAVENOUS | Status: DC | PRN
  Administered 2019-09-13: 250 mL via INTRAVENOUS

## 2019-09-13 MED ORDER — VANCOMYCIN HCL 125 MG PO CAPS: 125.0000 mg | ORAL_CAPSULE | Freq: Four times a day (QID) | ORAL | 0 refills | Status: AC

## 2019-09-13 NOTE — Progress Notes (Signed)
Pt d/c to home via sister in law. VSS. Education completed. All belongings sent with pt. All questions answered. Pt requested prescription be moved from [facility] on [address] to [facility] on [address] made note of that change below.

## 2019-09-13 NOTE — Progress Notes (Signed)
Called [facility] pharmacy on [address] to have pt's prescription transferred to [facility].  Spoke with Jodi Goodman.

## 2019-09-14 NOTE — Discharge Summary (Signed)
Physician Discharge Summary  Jodi Goodman DQQ:[id number] DOB: 1950-12-11 DOA: 09/09/2019  PCP: Sherron Monday, MD  Admit date: 09/09/2019 Discharge date: 09/14/2019  Recommendations for Outpatient Follow-up:  1. Follow up with PCP in 7-10 days. 2. Follow good hygeine practices for C. Diff.  Discharge Diagnoses: Principal diagnosis is #1 1. C diff colitis 2. Hypokalemia 3. Dehydration 4. Recent left knee arthroplasty  Discharge Condition: Fair  Disposition: Home  Diet recommendation: Regular  Filed Weights   09/09/19 1120  Weight: 65.8 kg    History of present illness:  Jodi Goodman is an 68 y.o. female with osteoarthritis status post left knee total arthroplasty on 08/10/2019, hypertension, hyperlipidemia, GERD.  She presented to the hospital because of diarrhea and vomiting.  She said diarrhea started about 4 days ago.  Stools are watery and nonbloody.  She has lost count of bowel movement and she said sometimes she goes to the toilet every 2-3 hours.  She also has dry heaves and vomits when she eats.  She has not been able to keep any food down.  Symptoms associated with abdominal cramps.  She is feeling very dehydrated, weak and tired.  She thinks she had a fever about 2 days ago because her temperature was checked, it was around 101.  She has no other complaints.  She had a headache yesterday but that has resolved.  No dizziness, loss of consciousness, chest pain, shortness of breath, cough, wheezing.  She has not had any antibiotics recently.  ED Course: CT abdomen and pelvis showed acute colitis.  She was also hypokalemic with potassium of 2.7.  She was given Zofran, potassium chloride, IV fentanyl and IV antibiotics (Cipro and Flagyl (in the emergency room.  Hospital Course:   The patient was admitted to a medical bed. The patient's stool was tested and was positive for C Diff. She has been placed on enteric precautions. IV Cipro has been started, and the  patient has been started on oral vancomycin. Her diarrhea has resolved over the last two days. She is tolerating a diet. She will be discharged to home.  Today's assessment: S: The patient is resting comfortably. No new complaints. O: Vitals:  Vitals:   09/13/19 0444 09/13/19 1031  BP: 121/81 113/79  Pulse: 86 80  Resp: 18 18  Temp: 98.7 F (37.1 C) 97.8 F (36.6 C)  SpO2: 100% 100%    Exam:  Constitutional:   The patient is awake, alert, and oriented x 3. No acute distress. Eyes:   pupils and irises appear normal  Normal lids and conjunctivae ENMT:   grossly normal hearing   Lips appear normal  external ears, nose appear normal  Oropharynx: mucosa, tongue,posterior pharynx appear normal Neck:   neck appears normal, no masses, normal ROM, supple  no thyromegaly Respiratory:   No increased work of breathing.  No wheezes, rales, or rhonchi  No tactile fremitus Cardiovascular:   Regular rate and rhythm  No murmurs, ectopy, or gallups.  No lateral PMI. No thrills. Abdomen:   Abdomen is soft, non-tender, non-distended  No hernias, masses, or organomegaly  Normoactive bowel sounds.  Musculoskeletal:   No cyanosis, clubbing, or edema Skin:   No rashes, lesions, ulcers  palpation of skin: no induration or nodules Neurologic:   CN 2-12 intact  Sensation all 4 extremities intact Psychiatric:   Mental status o Mood, affect appropriate o Orientation to person, place, time   judgment and insight appear intact   Discharge Instructions  Discharge Instructions  Activity as tolerated - No restrictions   Complete by: As directed    Call MD for:  persistant dizziness or light-headedness   Complete by: As directed    Call MD for:  persistant nausea and vomiting   Complete by: As directed    Diet - low sodium heart healthy   Complete by: As directed    Discharge instructions   Complete by: As directed    Follow up with PCP in 7-10  days. Follow good hygeine practices for C. Diff.   Increase activity slowly   Complete by: As directed      Allergies as of 09/13/2019      Reactions   Latex Hives   Penicillins Hives   Did it involve swelling of the face/tongue/throat, SOB, or low BP? Unknown Did it involve sudden or severe rash/hives, skin peeling, or any reaction on the inside of your mouth or nose? Yes Did you need to seek medical attention at a hospital or doctor's office? No When did it last happen?Over 40 years ago If all above answers are "NO", may proceed with cephalosporin use.   Tramadol Other (See Comments)   Dry mouth   Aspirin Nausea Only   Patient can tolerate coated aspirin   Sulfa Antibiotics Hives, Other (See Comments)   DRY MOUTH      Medication List    STOP taking these medications   apixaban 2.5 MG Tabs tablet Commonly known as: Eliquis   OVER THE COUNTER MEDICATION   oxyCODONE 5 MG immediate release tablet Commonly known as: Oxy IR/ROXICODONE   traMADol 50 MG tablet Commonly known as: ULTRAM     TAKE these medications   ADULT GUMMY PO Take 2 tablets by mouth daily with lunch. One-A-Day Women Gummy   ALPRAZolam 0.25 MG tablet Commonly known as: XANAX Take 0.25 mg by mouth 2 (two) times daily as needed for anxiety.   ASPERCREME LIDOCAINE EX Apply 1 application topically 3 (three) times daily as needed (pain.).   calcium carbonate 750 MG chewable tablet Commonly known as: TUMS EX Chew 1 tablet by mouth 3 (three) times daily as needed for heartburn.   chlorthalidone 25 MG tablet Commonly known as: HYGROTON Take 12.5 mg by mouth at bedtime.   ezetimibe 10 MG tablet Commonly known as: ZETIA Take 10 mg by mouth daily with lunch.   famotidine 20 MG tablet Commonly known as: PEPCID Take 20 mg by mouth 2 (two) times daily as needed for heartburn or indigestion.   ondansetron 4 MG tablet Commonly known as: ZOFRAN Take 1 tablet (4 mg total) by mouth every 6 (six)  hours as needed for nausea.   ramipril 10 MG capsule Commonly known as: ALTACE Take 10 mg by mouth at bedtime.   Systane 0.4-0.3 % Soln Generic drug: Polyethyl Glycol-Propyl Glycol Place 1 drop into both eyes 3 (three) times daily as needed (dry/irritated eyes.).   vancomycin 125 MG capsule Commonly known as: VANCOCIN Take 1 capsule (125 mg total) by mouth 4 (four) times daily for 5 days.      Allergies  Allergen Reactions   Latex Hives   Penicillins Hives    Did it involve swelling of the face/tongue/throat, SOB, or low BP? Unknown Did it involve sudden or severe rash/hives, skin peeling, or any reaction on the inside of your mouth or nose? Yes Did you need to seek medical attention at a hospital or doctor's office? No When did it last happen?Over 40 years ago If all above  answers are "NO", may proceed with cephalosporin use.   Tramadol Other (See Comments)    Dry mouth   Aspirin Nausea Only    Patient can tolerate coated aspirin   Sulfa Antibiotics Hives and Other (See Comments)    DRY MOUTH    The results of significant diagnostics from this hospitalization (including imaging, microbiology, ancillary and laboratory) are listed below for reference.    Significant Diagnostic Studies: Ct Abdomen Pelvis W Contrast  Result Date: 09/09/2019 CLINICAL DATA:  Diarrhea.  Assess for colitis or diverticulitis. EXAM: CT ABDOMEN AND PELVIS WITH CONTRAST TECHNIQUE: Multidetector CT imaging of the abdomen and pelvis was performed using the standard protocol following bolus administration of intravenous contrast. CONTRAST:  100mL OMNIPAQUE IOHEXOL 300 MG/ML  SOLN COMPARISON:  Ultrasound 09/02/2008 FINDINGS: Lower chest: Normal Hepatobiliary: Normal Pancreas: Normal Spleen: Normal Adrenals/Urinary Tract: Adrenal glands are normal. Kidneys are normal. No cyst, mass, stone or hydronephrosis. Bladder is normal. Stomach/Bowel: Advanced colitis pattern throughout the colon, more severe  on the left. Relative sparing of the cecum and ascending colon. Infectious colitis would be most common. Inflammatory bowel disease not excluded. No sign of diverticulosis or diverticulitis. No perforation or abscess. Vascular/Lymphatic: Normal except for minimal iliac artery atherosclerosis. Reproductive: Normal.  No pelvic mass. Other: No free fluid or air. Musculoskeletal: Mild lower lumbar degenerative changes. IMPRESSION: Advanced colitis, particularly affecting the transverse and left colon. Findings most likely infectious. Inflammatory bowel disease could have a similar appearance. No evidence of perforation or abscess. Electronically Signed   By: Paulina FusiMark  Shogry M.D.   On: 09/09/2019 14:12    Microbiology: Recent Results (from the past 240 hour(s))  SARS CORONAVIRUS 2 (TAT 6-24 HRS) Nasopharyngeal Nasopharyngeal Swab     Status: None   Collection Time: 09/09/19  4:23 PM   Specimen: Nasopharyngeal Swab  Result Value Ref Range Status   SARS Coronavirus 2 NEGATIVE NEGATIVE Final    Comment: (NOTE) SARS-CoV-2 target nucleic acids are NOT DETECTED. The SARS-CoV-2 RNA is generally detectable in upper and lower respiratory specimens during the acute phase of infection. Negative results do not preclude SARS-CoV-2 infection, do not rule out co-infections with other pathogens, and should not be used as the sole basis for treatment or other patient management decisions. Negative results must be combined with clinical observations, patient history, and epidemiological information. The expected result is Negative. Fact Sheet for Patients: HairSlick.nohttps://www.fda.gov/media/138098/download Fact Sheet for Healthcare Providers: quierodirigir.comhttps://www.fda.gov/media/138095/download This test is not yet approved or cleared by the [place] FDA and  has been authorized for detection and/or diagnosis of SARS-CoV-2 by FDA under an Emergency Use Authorization (EUA). This EUA will remain  in effect (meaning this test can be  used) for the duration of the COVID-19 declaration under Section 56 4(b)(1) of the Act, 21 U.S.C. section 360bbb-3(b)(1), unless the authorization is terminated or revoked sooner. Performed at [facility], [address] [place], [place] [address]   Gastrointestinal Panel by PCR , Stool     Status: None   Collection Time: 09/09/19  6:54 PM   Specimen: Stool  Result Value Ref Range Status   Campylobacter species NOT DETECTED NOT DETECTED Final   Plesimonas shigelloides NOT DETECTED NOT DETECTED Final   Salmonella species NOT DETECTED NOT DETECTED Final   Yersinia enterocolitica NOT DETECTED NOT DETECTED Final   Vibrio species NOT DETECTED NOT DETECTED Final   Vibrio cholerae NOT DETECTED NOT DETECTED Final   Enteroaggregative E coli (EAEC) NOT DETECTED NOT DETECTED Final  Enteropathogenic E coli (EPEC) NOT DETECTED NOT DETECTED Final   Enterotoxigenic E coli (ETEC) NOT DETECTED NOT DETECTED Final   Shiga like toxin producing E coli (STEC) NOT DETECTED NOT DETECTED Final   Shigella/Enteroinvasive E coli (EIEC) NOT DETECTED NOT DETECTED Final   Cryptosporidium NOT DETECTED NOT DETECTED Final   Cyclospora cayetanensis NOT DETECTED NOT DETECTED Final   Entamoeba histolytica NOT DETECTED NOT DETECTED Final   Giardia lamblia NOT DETECTED NOT DETECTED Final   Adenovirus F40/41 NOT DETECTED NOT DETECTED Final   Astrovirus NOT DETECTED NOT DETECTED Final   Norovirus GI/GII NOT DETECTED NOT DETECTED Final   Rotavirus A NOT DETECTED NOT DETECTED Final   Sapovirus (I, II, IV, and V) NOT DETECTED NOT DETECTED Final    Comment: Performed at [facility], [address] [place], [place] [address]  C Difficile Quick Screen w PCR reflex     Status: Abnormal   Collection Time: 09/09/19  6:54 PM   Specimen: Stool  Result Value Ref Range Status   C Diff antigen POSITIVE (A) NEGATIVE Final   C Diff toxin POSITIVE (A) NEGATIVE Final   C Diff interpretation Toxin producing C.  difficile detected.  Final    Comment: CRITICAL RESULT CALLED TO, READ BACK BY AND VERIFIED WITH: [facility] WEAVER 09/09/19 1956 KLW Performed at [facility], [address] [place], [place] [address]      Labs: Basic Metabolic Panel: Recent Labs  Lab 09/09/19 1127 09/10/19 0448 09/11/19 0447 09/12/19 0533 09/13/19 0619  NA 138 141 138 141 140  K 2.7* 3.3* 3.6 3.7 3.0*  CL 104 113* 109 111 106  CO2 22 21* 19* 22 22  GLUCOSE 108* 103* 95 96 103*  BUN 12 8 <5* <5* <5*  CREATININE 0.78 0.53 0.52 0.56 0.64  CALCIUM 8.8* 8.3* 8.6* 8.9 9.1  MG 2.0 1.9  --   --   --    Liver Function Tests: Recent Labs  Lab 09/09/19 1127 09/11/19 0447  AST 28 18  ALT 19 17  ALKPHOS 90 77  BILITOT 0.8 0.6  PROT 6.4* 5.8*  ALBUMIN 3.1* 2.9*   Recent Labs  Lab 09/09/19 1127  LIPASE 21   No results for input(s): AMMONIA in the last 168 hours. CBC: Recent Labs  Lab 09/09/19 1127 09/10/19 0448 09/11/19 0447  WBC 15.3* 10.1 11.1*  HGB 11.5* 9.9* 10.7*  HCT 34.4* 30.8* 31.7*  MCV 90.1 93.3 88.3  PLT 368 297 324   Cardiac Enzymes: No results for input(s): CKTOTAL, CKMB, CKMBINDEX, TROPONINI in the last 168 hours. BNP: BNP (last 3 results) No results for input(s): BNP in the last 8760 hours.  ProBNP (last 3 results) No results for input(s): PROBNP in the last 8760 hours.  CBG: No results for input(s): GLUCAP in the last 168 hours.  Principal Problem:   Acute colitis Active Problems:   Hypokalemia   Dehydration   Time coordinating discharge: 38 minutes  Signed:        Florentine Diekman, DO [facility]  09/14/2019, 6:18 PM

## 2019-11-16 ENCOUNTER — Other Ambulatory Visit: Payer: Self-pay | Admitting: Internal Medicine

## 2019-11-16 DIAGNOSIS — Z1231 Encounter for screening mammogram for malignant neoplasm of breast: Secondary | ICD-10-CM

## 2019-12-27 ENCOUNTER — Ambulatory Visit
Admission: RE | Admit: 2019-12-27 | Discharge: 2019-12-27 | Disposition: A | Discharge status: Home / Self Care | Payer: Medicare HMO | Source: Ambulatory Visit | Attending: Internal Medicine | Admitting: Internal Medicine

## 2019-12-27 DIAGNOSIS — Z1231 Encounter for screening mammogram for malignant neoplasm of breast: Secondary | ICD-10-CM

## 2020-06-14 ENCOUNTER — Other Ambulatory Visit: Payer: Self-pay

## 2020-06-14 ENCOUNTER — Encounter: Payer: Self-pay | Admitting: Emergency Medicine

## 2020-06-14 DIAGNOSIS — Z91040 Latex allergy status: Secondary | ICD-10-CM | POA: Diagnosis not present

## 2020-06-14 DIAGNOSIS — R05 Cough: Secondary | ICD-10-CM | POA: Diagnosis not present

## 2020-06-14 DIAGNOSIS — T17920A Food in respiratory tract, part unspecified causing asphyxiation, initial encounter: Secondary | ICD-10-CM | POA: Diagnosis present

## 2020-06-14 DIAGNOSIS — I10 Essential (primary) hypertension: Secondary | ICD-10-CM | POA: Diagnosis not present

## 2020-06-14 DIAGNOSIS — Y929 Unspecified place or not applicable: Secondary | ICD-10-CM | POA: Diagnosis not present

## 2020-06-14 DIAGNOSIS — X58XXXA Exposure to other specified factors, initial encounter: Secondary | ICD-10-CM | POA: Insufficient documentation

## 2020-06-14 DIAGNOSIS — F458 Other somatoform disorders: Secondary | ICD-10-CM | POA: Diagnosis not present

## 2020-06-14 DIAGNOSIS — Z96652 Presence of left artificial knee joint: Secondary | ICD-10-CM | POA: Insufficient documentation

## 2020-06-14 DIAGNOSIS — Y939 Activity, unspecified: Secondary | ICD-10-CM | POA: Insufficient documentation

## 2020-06-14 DIAGNOSIS — Y999 Unspecified external cause status: Secondary | ICD-10-CM | POA: Diagnosis not present

## 2020-06-14 NOTE — ED Notes (Signed)
Verbal order for GI cocktail by MD Erma Heritage and coke to start with. Pt to move to subwait for monitoring.

## 2020-06-14 NOTE — ED Triage Notes (Signed)
Pt presents via POV with c/o food being stuck in throat. Pt states at home she was eating chicken strips and piece got caught in throat. Pt states she had to do heimlich maneuver on self to get piece of chicken dislodged. Pt states it still feels like something is stuck in throat. Pt currently alert and oriented x4. Respirations even and unlabored. VSS

## 2020-06-15 ENCOUNTER — Emergency Department
Admission: EM | Admit: 2020-06-15 | Discharge: 2020-06-15 | Disposition: A | Discharge status: Home / Self Care | Payer: Medicare HMO | Attending: Emergency Medicine | Admitting: Emergency Medicine

## 2020-06-15 DIAGNOSIS — R0989 Other specified symptoms and signs involving the circulatory and respiratory systems: Secondary | ICD-10-CM

## 2020-06-15 MED ORDER — MAGIC MOUTHWASH W/LIDOCAINE: 5.0000 mL | Freq: Four times a day (QID) | ORAL | 0 refills | Status: DC | PRN

## 2020-06-15 NOTE — ED Provider Notes (Signed)
[facility] Emergency Department Provider Note  ____________________________________________   First MD Initiated Contact with Patient 06/15/20 [phone]     (approximate)  I have reviewed the triage vital signs and the nursing notes.   HISTORY  Chief Complaint Choking    HPI Mills KollerJanette Hawks is a 69 y.o. female with medical history as listed below who presents for evaluation after choking on a piece of chicken tender approximately 12 hours ago.  She states that she feels like a piece of chicken got stuck in the back of her throat and she was not able to swallow or breathe very well.  She performed the Heimlich maneuver on herself and said that a piece of chicken "shot out onto the floor".  She still felt like she had something stuck, however, and had some discomfort in the back left part of her throat.  She has been able to drink fluids today but has been a little bit nervous to try eating anything.  She is tolerating her own secretions without difficulty, speaking comfortably, and having no respiratory distress.  Nothing in particular made her symptoms better or worse and her episode was acute in onset and severe.  She felt fine before the episode and has not been ill recently.  She has not had fever or chills, chest pain, shortness of breath, cough, nausea, vomiting, nor abdominal pain.  She did have several coughing episodes after dislodging the piece of chicken from her throat but that has improved.  She has been waiting nearly 11 hours in the emergency department due to the overwhelming patient volume in the emergency department in hospital and she has been in no distress during that time.         Past Medical History:  Diagnosis Date  . Anemia    H/O  . Anxiety   . Arthritis   . Bruises easily   . Dysrhythmia    PALPITATIONS  . GERD (gastroesophageal reflux disease)   . Hyperlipidemia   . Hypertension     Patient Active Problem List   Diagnosis  Date Noted  . Acute colitis 09/09/2019  . Hypokalemia 09/09/2019  . Dehydration 09/09/2019  . 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 Chronic pain of left knee 02/11/2019  . Chronic pain syndrome 02/11/2019  . Primary osteoarthritis of left knee 05/08/2015    Past Surgical History:  Procedure Laterality Date  . CESAREAN SECTION    . COLONOSCOPY    . EYE SURGERY Bilateral    laser to drain fluid  . KNEE ARTHROSCOPY WITH MENISCAL REPAIR Left 09/26/2015   Procedure: KNEE ARTHROSCOPY WITH partial lateral menisectomy and abrasion chondoplasty of patella.;  Surgeon: Christena FlakeJohn J Poggi, MD;  Location: [facility] ORS;  Service: Orthopedics;  Laterality: Left;  . 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   Procedure: TOTAL KNEE ARTHROPLASTY;  Surgeon: Christena FlakePoggi, John J, MD;  Location: [facility] ORS;  Service: Orthopedics;  Laterality: Left;  . WISDOM TOOTH EXTRACTION      Prior to Admission medications   Medication Sig Start Date End Date Taking? Authorizing Provider  ALPRAZolam (XANAX) 0.25 MG tablet Take 0.25 mg by mouth 2 (two) times daily as needed for anxiety.    [provider]  ASPERCREME LIDOCAINE EX Apply 1 application topically 3 (three) times daily as needed (pain.).    [provider]  calcium carbonate (TUMS EX) 750 MG chewable tablet Chew 1 tablet by mouth 3 (three) times daily as needed for heartburn.  [provider]  chlorthalidone (HYGROTON) 25 MG tablet Take 12.5 mg by mouth at bedtime.     [provider]  ezetimibe (ZETIA) 10 MG tablet Take 10 mg by mouth daily with lunch.    [provider]  famotidine (PEPCID) 20 MG tablet Take 20 mg by mouth 2 (two) times daily as needed for heartburn or indigestion.    [provider]  magic mouthwash w/lidocaine SOLN Take 5 mLs by mouth 4 (four) times daily as needed for mouth pain. Swish and spit, do not swallow the solution. 06/15/20   Loleta Rose, MD  Multiple  Vitamins-Minerals (ADULT GUMMY PO) Take 2 tablets by mouth daily with lunch. One-A-Day Women Gummy    [provider]  ondansetron (ZOFRAN) 4 MG tablet Take 1 tablet (4 mg total) by mouth every 6 (six) hours as needed for nausea. 08/12/19   Anson Oregon, PA-C  Polyethyl Glycol-Propyl Glycol (SYSTANE) 0.4-0.3 % SOLN Place 1 drop into both eyes 3 (three) times daily as needed (dry/irritated eyes.).    [provider]  ramipril (ALTACE) 10 MG capsule Take 10 mg by mouth at bedtime.    [provider]    Allergies Latex, Penicillins, Tramadol, Aspirin, and Sulfa antibiotics  History reviewed. No pertinent family history.  Social History Social History   Tobacco Use  . Smoking status: Never Smoker  . Smokeless tobacco: Never Used  Vaping Use  . Vaping Use: Never used  Substance Use Topics  . Alcohol use: No  . Drug use: No    Review of Systems onstitutional: No fever/chills Eyes: No visual changes. ENT: Mild sore throat after choking episode with foreign body sensation. Cardiovascular: Denies chest pain. Respiratory: Some coughing after choking episode, now resolved.  Denies shortness of breath. Gastrointestinal: No abdominal pain.  No nausea, no vomiting.  No diarrhea.  No constipation. Genitourinary: Negative for dysuria. Musculoskeletal: Negative for neck pain.  Negative for back pain. Integumentary: Negative for rash. Neurological: Negative for headaches, focal weakness or numbness.   ____________________________________________   PHYSICAL EXAM:  VITAL SIGNS: ED Triage Vitals  Enc Vitals Group     BP 06/14/20 1832 (!) 149/96     Pulse Rate 06/14/20 1832 89     Resp 06/14/20 1832 20     Temp 06/14/20 1832 99 F (37.2 C)     Temp Source 06/14/20 1832 Oral     SpO2 06/14/20 1832 96 %     Weight 06/14/20 1833 61.7 kg (136 lb)     Height 06/14/20 1833 1.626 m (5\' 4" )     Head Circumference --      Peak Flow --      Pain Score  06/14/20 1837 0     Pain Loc --      Pain Edu? --      Excl. in GC? --     Constitutional: Alert and oriented.  Eyes: Conjunctivae are normal.  Head: Atraumatic. Nose: No congestion/rhinnorhea. Mouth/Throat: Patient is wearing a mask.  On exam the patient has what appears to be a little bit of erythema and irritation in the posterior pharynx on the left side but without any edema, evidence of infection, petechiae.  Specifically the patient has no evidence of Ludwig's angina nor peritonsillar abscess. Neck: No stridor.  No meningeal signs.  No cervical lymphadenopathy, no submandibular brawny induration, nontender to palpation. Cardiovascular: Normal rate, regular rhythm. Good peripheral circulation. Grossly normal heart sounds. Respiratory: Normal respiratory effort.  No retractions. Gastrointestinal:  Soft and nontender. No distention.  Musculoskeletal: No lower extremity tenderness nor edema. No gross deformities of extremities. Neurologic:  Normal speech and language. No gross focal neurologic deficits are appreciated.  Skin:  Skin is warm, dry and intact. Psychiatric: Mood and affect are normal. Speech and behavior are normal.  ____________________________________________   LABS (all labs ordered are listed, but only abnormal results are displayed)  Labs Reviewed - No data to display ____________________________________________  EKG  No indication for emergent EKG ____________________________________________  RADIOLOGY I, Loleta Rose, personally viewed and evaluated these images (plain radiographs) as part of my medical decision making, as well as reviewing the written report by the radiologist.  ED MD interpretation: No indication for emergent imaging  Official radiology report(s): No results found.  ____________________________________________   PROCEDURES   Procedure(s) performed (including Critical  Care):  Procedures   ____________________________________________   INITIAL IMPRESSION / MDM / ASSESSMENT AND PLAN / ED COURSE  As part of my medical decision making, I reviewed the following data within the electronic MEDICAL RECORD NUMBER Nursing notes reviewed and incorporated, Old chart reviewed and Notes from prior ED visits   Differential diagnosis includes, but is not limited to, globus sensation, retained foreign body, peritonsillar abscess, Ludwig's angina, other nonspecific dental infection, epiglottitis.  The patient is well-appearing and in no distress.  She has been waiting in the emergency department waiting room for almost 11 hours.  She has been able to drink during that time and has some relief from the solution she was given which I believe was viscous lidocaine.  She is in no respiratory distress, not tripoding, tolerating her secretions without difficulty.  I had an extended conversation with her and she talked about her anxiety as well and I believe this is also playing a role in her visit and her initial distress after the frightening episode she had.  I provided reassurance and explained there is not a role for imaging at this time.  I explained to her about globus sensation and encouraged her to try a soft food diet for the next couple of days for her comfort.  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  I provided a phone number with her for [facility] oral and maxillofacial surgery.  I also encouraged her to follow-up with a gastroenterologist in case she is having issues with achalasia because she says she does occasionally choke on her food.  She understands and agrees with the plan and I gave my usual and customary return precautions.           ____________________________________________  FINAL CLINICAL IMPRESSION(S) / ED  DIAGNOSES  Final diagnoses:  Globus sensation     MEDICATIONS GIVEN DURING THIS VISIT:  Medications - No data to display   ED Discharge Orders         Ordered    magic mouthwash w/lidocaine SOLN  4 times daily PRN       Note to Pharmacy: Please mix viscous lidocaine 2% with magic mouthwash solution so that the lidocaine comprises approximately 25 % of the total solution.   06/15/20 0438          *Please note:  Chelci Wintermute was evaluated in Emergency Department on 06/15/2020 for the symptoms described in the history of present illness. She was evaluated in the context of the global COVID-19 pandemic, which necessitated  consideration that the patient might be at risk for infection with the SARS-CoV-2 virus that causes COVID-19. Institutional protocols and algorithms that pertain to the evaluation of patients at risk for COVID-19 are in a state of rapid change based on information released by regulatory bodies including the CDC and federal and state organizations. These policies and algorithms were followed during the patient's care in the ED.  Some ED evaluations and interventions may be delayed as a result of limited staffing during and after the pandemic.*  Note:  This document was prepared using Dragon voice recognition software and may include unintentional dictation errors.   Loleta Rose, MD 06/15/20 [phone]

## 2020-06-15 NOTE — ED Notes (Signed)
Pt gave this RN verbal consent for DC, sign pad unavailable.

## 2020-06-15 NOTE — Discharge Instructions (Signed)
As we discussed, we do not believe you have a foreign body still in the back of your mouth or your throat.  After having an object or piece of food stuck in your mouth and throat, it can continue feeling like something is there for quite a while, which is known as a globus sensation.  Please try sticking with a soft food diet for couple of days.  Go ahead and take your regular medications but try to make sure the pills are a small as possible and that you drink that with plenty of water.  I recommend you follow-up with your primary care doctor to discuss the issue but I also provided a phone number that you can call to schedule appointment with gastroenterology if you would like to discuss having an upper endoscopy to see if your esophagus needs to be dilated.  I also provided a number for [facility] oral and maxillofacial surgery given the issues you have had with your jaw.    Return to the emergency department if you develop new or worsening symptoms that concern you.

## 2021-01-23 ENCOUNTER — Other Ambulatory Visit: Payer: Self-pay | Admitting: Internal Medicine

## 2021-01-23 DIAGNOSIS — Z1231 Encounter for screening mammogram for malignant neoplasm of breast: Secondary | ICD-10-CM

## 2021-02-05 ENCOUNTER — Ambulatory Visit
Admission: RE | Admit: 2021-02-05 | Discharge: 2021-02-05 | Disposition: A | Discharge status: Home / Self Care | Payer: Medicare HMO | Source: Ambulatory Visit | Attending: Internal Medicine | Admitting: Internal Medicine

## 2021-02-05 ENCOUNTER — Other Ambulatory Visit: Payer: Self-pay

## 2021-02-05 DIAGNOSIS — Z1231 Encounter for screening mammogram for malignant neoplasm of breast: Secondary | ICD-10-CM | POA: Insufficient documentation

## 2021-02-20 DIAGNOSIS — E559 Vitamin D deficiency, unspecified: Secondary | ICD-10-CM | POA: Diagnosis not present

## 2021-02-20 DIAGNOSIS — E785 Hyperlipidemia, unspecified: Secondary | ICD-10-CM | POA: Diagnosis not present

## 2021-02-26 DIAGNOSIS — Z96652 Presence of left artificial knee joint: Secondary | ICD-10-CM | POA: Diagnosis not present

## 2021-02-26 DIAGNOSIS — M1712 Unilateral primary osteoarthritis, left knee: Secondary | ICD-10-CM | POA: Diagnosis not present

## 2021-03-27 DIAGNOSIS — I10 Essential (primary) hypertension: Secondary | ICD-10-CM | POA: Diagnosis not present

## 2021-03-27 DIAGNOSIS — M13862 Other specified arthritis, left knee: Secondary | ICD-10-CM | POA: Diagnosis not present

## 2021-03-27 DIAGNOSIS — E559 Vitamin D deficiency, unspecified: Secondary | ICD-10-CM | POA: Diagnosis not present

## 2021-03-27 DIAGNOSIS — E785 Hyperlipidemia, unspecified: Secondary | ICD-10-CM | POA: Diagnosis not present

## 2021-03-27 DIAGNOSIS — R69 Illness, unspecified: Secondary | ICD-10-CM | POA: Diagnosis not present

## 2021-03-27 DIAGNOSIS — N951 Menopausal and female climacteric states: Secondary | ICD-10-CM | POA: Diagnosis not present

## 2021-03-27 DIAGNOSIS — G5600 Carpal tunnel syndrome, unspecified upper limb: Secondary | ICD-10-CM | POA: Diagnosis not present

## 2021-03-27 DIAGNOSIS — K219 Gastro-esophageal reflux disease without esophagitis: Secondary | ICD-10-CM | POA: Diagnosis not present

## 2021-03-27 DIAGNOSIS — R609 Edema, unspecified: Secondary | ICD-10-CM | POA: Diagnosis not present

## 2021-05-21 DIAGNOSIS — E785 Hyperlipidemia, unspecified: Secondary | ICD-10-CM | POA: Diagnosis not present

## 2021-05-28 DIAGNOSIS — G5600 Carpal tunnel syndrome, unspecified upper limb: Secondary | ICD-10-CM | POA: Diagnosis not present

## 2021-05-28 DIAGNOSIS — M13862 Other specified arthritis, left knee: Secondary | ICD-10-CM | POA: Diagnosis not present

## 2021-05-28 DIAGNOSIS — R609 Edema, unspecified: Secondary | ICD-10-CM | POA: Diagnosis not present

## 2021-05-28 DIAGNOSIS — E559 Vitamin D deficiency, unspecified: Secondary | ICD-10-CM | POA: Diagnosis not present

## 2021-05-28 DIAGNOSIS — I10 Essential (primary) hypertension: Secondary | ICD-10-CM | POA: Diagnosis not present

## 2021-05-28 DIAGNOSIS — K219 Gastro-esophageal reflux disease without esophagitis: Secondary | ICD-10-CM | POA: Diagnosis not present

## 2021-05-28 DIAGNOSIS — R69 Illness, unspecified: Secondary | ICD-10-CM | POA: Diagnosis not present

## 2021-05-28 DIAGNOSIS — N951 Menopausal and female climacteric states: Secondary | ICD-10-CM | POA: Diagnosis not present

## 2021-05-28 DIAGNOSIS — E785 Hyperlipidemia, unspecified: Secondary | ICD-10-CM | POA: Diagnosis not present

## 2021-07-03 DIAGNOSIS — Z0100 Encounter for examination of eyes and vision without abnormal findings: Secondary | ICD-10-CM | POA: Diagnosis not present

## 2021-07-12 DIAGNOSIS — J101 Influenza due to other identified influenza virus with other respiratory manifestations: Secondary | ICD-10-CM | POA: Diagnosis not present

## 2021-07-12 DIAGNOSIS — R059 Cough, unspecified: Secondary | ICD-10-CM | POA: Diagnosis not present

## 2021-07-12 DIAGNOSIS — Z20822 Contact with and (suspected) exposure to covid-19: Secondary | ICD-10-CM | POA: Diagnosis not present

## 2021-08-27 DIAGNOSIS — E785 Hyperlipidemia, unspecified: Secondary | ICD-10-CM | POA: Diagnosis not present

## 2021-08-27 DIAGNOSIS — E559 Vitamin D deficiency, unspecified: Secondary | ICD-10-CM | POA: Diagnosis not present

## 2021-08-28 DIAGNOSIS — G5600 Carpal tunnel syndrome, unspecified upper limb: Secondary | ICD-10-CM | POA: Diagnosis not present

## 2021-08-28 DIAGNOSIS — R69 Illness, unspecified: Secondary | ICD-10-CM | POA: Diagnosis not present

## 2021-08-28 DIAGNOSIS — E559 Vitamin D deficiency, unspecified: Secondary | ICD-10-CM | POA: Diagnosis not present

## 2021-08-28 DIAGNOSIS — F411 Generalized anxiety disorder: Secondary | ICD-10-CM | POA: Diagnosis not present

## 2021-08-28 DIAGNOSIS — E785 Hyperlipidemia, unspecified: Secondary | ICD-10-CM | POA: Diagnosis not present

## 2021-08-28 DIAGNOSIS — K219 Gastro-esophageal reflux disease without esophagitis: Secondary | ICD-10-CM | POA: Diagnosis not present

## 2021-08-28 DIAGNOSIS — I10 Essential (primary) hypertension: Secondary | ICD-10-CM | POA: Diagnosis not present

## 2021-08-28 DIAGNOSIS — M13862 Other specified arthritis, left knee: Secondary | ICD-10-CM | POA: Diagnosis not present

## 2021-08-28 DIAGNOSIS — N951 Menopausal and female climacteric states: Secondary | ICD-10-CM | POA: Diagnosis not present

## 2021-08-28 DIAGNOSIS — R609 Edema, unspecified: Secondary | ICD-10-CM | POA: Diagnosis not present

## 2021-10-28 DIAGNOSIS — J019 Acute sinusitis, unspecified: Secondary | ICD-10-CM | POA: Diagnosis not present

## 2021-10-28 DIAGNOSIS — R059 Cough, unspecified: Secondary | ICD-10-CM | POA: Diagnosis not present

## 2021-10-28 DIAGNOSIS — B9689 Other specified bacterial agents as the cause of diseases classified elsewhere: Secondary | ICD-10-CM | POA: Diagnosis not present

## 2021-10-28 DIAGNOSIS — Z20822 Contact with and (suspected) exposure to covid-19: Secondary | ICD-10-CM | POA: Diagnosis not present

## 2021-12-07 DIAGNOSIS — E785 Hyperlipidemia, unspecified: Secondary | ICD-10-CM | POA: Diagnosis not present

## 2021-12-07 DIAGNOSIS — E559 Vitamin D deficiency, unspecified: Secondary | ICD-10-CM | POA: Diagnosis not present

## 2021-12-07 DIAGNOSIS — I10 Essential (primary) hypertension: Secondary | ICD-10-CM | POA: Diagnosis not present

## 2021-12-10 DIAGNOSIS — G5600 Carpal tunnel syndrome, unspecified upper limb: Secondary | ICD-10-CM | POA: Diagnosis not present

## 2021-12-10 DIAGNOSIS — K219 Gastro-esophageal reflux disease without esophagitis: Secondary | ICD-10-CM | POA: Diagnosis not present

## 2021-12-10 DIAGNOSIS — F411 Generalized anxiety disorder: Secondary | ICD-10-CM | POA: Diagnosis not present

## 2021-12-10 DIAGNOSIS — E559 Vitamin D deficiency, unspecified: Secondary | ICD-10-CM | POA: Diagnosis not present

## 2021-12-10 DIAGNOSIS — I10 Essential (primary) hypertension: Secondary | ICD-10-CM | POA: Diagnosis not present

## 2021-12-10 DIAGNOSIS — N951 Menopausal and female climacteric states: Secondary | ICD-10-CM | POA: Diagnosis not present

## 2021-12-10 DIAGNOSIS — E785 Hyperlipidemia, unspecified: Secondary | ICD-10-CM | POA: Diagnosis not present

## 2021-12-10 DIAGNOSIS — Z0001 Encounter for general adult medical examination with abnormal findings: Secondary | ICD-10-CM | POA: Diagnosis not present

## 2021-12-10 DIAGNOSIS — M13862 Other specified arthritis, left knee: Secondary | ICD-10-CM | POA: Diagnosis not present

## 2021-12-10 DIAGNOSIS — Z1331 Encounter for screening for depression: Secondary | ICD-10-CM | POA: Diagnosis not present

## 2021-12-10 DIAGNOSIS — R609 Edema, unspecified: Secondary | ICD-10-CM | POA: Diagnosis not present

## 2021-12-10 DIAGNOSIS — R69 Illness, unspecified: Secondary | ICD-10-CM | POA: Diagnosis not present

## 2022-02-05 ENCOUNTER — Other Ambulatory Visit: Payer: Self-pay | Admitting: Internal Medicine

## 2022-02-05 DIAGNOSIS — Z1231 Encounter for screening mammogram for malignant neoplasm of breast: Secondary | ICD-10-CM

## 2022-03-11 DIAGNOSIS — E559 Vitamin D deficiency, unspecified: Secondary | ICD-10-CM | POA: Diagnosis not present

## 2022-03-11 DIAGNOSIS — E785 Hyperlipidemia, unspecified: Secondary | ICD-10-CM | POA: Diagnosis not present

## 2022-03-25 DIAGNOSIS — Z96652 Presence of left artificial knee joint: Secondary | ICD-10-CM | POA: Diagnosis not present

## 2022-03-25 DIAGNOSIS — M25562 Pain in left knee: Secondary | ICD-10-CM | POA: Diagnosis not present

## 2022-03-26 ENCOUNTER — Ambulatory Visit: Payer: Medicare HMO

## 2022-04-01 ENCOUNTER — Ambulatory Visit: Payer: Medicare HMO | Attending: Dentistry

## 2022-04-01 DIAGNOSIS — R293 Abnormal posture: Secondary | ICD-10-CM | POA: Diagnosis not present

## 2022-04-01 DIAGNOSIS — M6281 Muscle weakness (generalized): Secondary | ICD-10-CM | POA: Diagnosis not present

## 2022-04-01 NOTE — Therapy (Addendum)
[facility] [address], [place], [address] Phone: [phone]   Fax:  [phone]  Physical Therapy Evaluation  Patient Details  Name: Jodi Goodman MRN: [id number] Date of Birth: 07-Jul-1951 Referring Provider (PT): Jeannette How, DMD   Encounter Date: 04/01/2022   PT End of Session - 04/01/22 1331     Visit Number 1    Number of Visits 17    Date for PT Re-Evaluation 05/30/22    Authorization Type 1    Authorization Time Period 10    PT Start Time 1332    PT Stop Time 1415    PT Time Calculation (min) 43 min    Activity Tolerance Patient tolerated treatment well    Behavior During Therapy WFL for tasks assessed/performed             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   Procedure: TOTAL KNEE ARTHROPLASTY;  Surgeon: Christena Flake, MD;  Location: [facility] ORS;  Service: Orthopedics;  Laterality: Left;   WISDOM TOOTH EXTRACTION      There were no vitals filed for this visit.    Subjective Assessment - 04/01/22 1333     Subjective R jaw pain: 0/10 currently. Has not really bothered her for the past 3 months because she has been eating soft foods.    Pertinent History TMJ. Pain began gradually  about 1 year ago. R temporomandibular joint bothers her. Was told that she has subluxation and loose joint.  Choked one time and had to do the heimlich maneuver on herself. Has been eating soft foods since her jaw problem. Pain has gotten a little bit better after the dentist adjusted her denture. Clenches her teeth at night while sleeping. Feels like her jaw muscles are so slack that she has a hard time chewing.    Patient Stated Goals Eat and chew better, get the motion of chewing back.    Currently in Pain? No/denies    Pain Location Jaw    Pain Orientation Right    Pain Type Chronic pain    Pain Onset More than a month ago    Pain Frequency Occasional    Aggravating Factors  eating harder food    Pain Relieving Factors eating soft food.                [facility] PT Assessment - 04/01/22 1342       Assessment   Medical Diagnosis TMJ click    Referring Provider (PT) Jeannette How, DMD    Onset Date/Surgical Date 02/26/22   Date PT referral signed   Prior Therapy No known physical therapy for current condition.      Observation/Other Assessments   Focus on Therapeutic Outcomes (FOTO)  Orofacial FOTO: 40  Posture/Postural Control   Posture Comments forward neck, slight R lateral shift neck, B protracted shoulders, R jaw deviation      AROM   Overall AROM Comments R jaw deviation with opening mouth. Decreased steadiness with jaw when trying to close mouth.    Cervical Flexion full    Cervical Extension WFL    Cervical - Right Side Bend WFL    Cervical - Left Side Bend WFL    Cervical - Right Rotation WFL with cervical extension    Cervical - Left Rotation WFL with cervical extension      Strength   Overall Strength Comments Manually resisted scapular retraction targeting lower trap: 4/5 R and L.      Palpation   Palpation comment No TTP B jaw. Decreased jaw control with opening and closing jaw.                        Objective measurements completed on examination: See above findings.   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      Therapeutic exercise  Seated chin tucks 10x5 seocnds   Then with B scapular retraction 10x5 seconds    Seated gentle manually resisted L jaw deviation isometrics 10x5 seconds for 3 sets       Improved exercise technique, movement at target joints, use of target muscles after mod verbal, visual, tactile cues.      Access Code: [email or web address] URL: [email or web address] Date: 04/01/2022 Prepared by: Joneen Boers  Exercises - Seated Cervical Retraction  - 1 x daily - 7 x weekly - 3 sets - 10 reps - 5 seconds hold - Isometric Jaw Deviation  - 1 x daily - 7 x weekly - 3 sets - 10 reps - 5 seconds hold - Seated Scapular Retraction  - 1 x daily - 7 x weekly - 3 sets - 10 reps - 5 seconds  hold  Response to treatment Pt tolerated session well without aggravation of symptoms.     Clinical impression  Pt is a 71 year old female who came to physical therapy secondary to temporomandibular joint pain. She also demonstrates R jaw deviation, decreased jaw control; jaw, cervical, and B scapular weakness, poor posture, and difficulty chewing due to pain. Pt will benefit from skilled physical therapy services to address the aforementioned deficits.                   PT Short Term Goals - 04/01/22 1822       PT SHORT TERM GOAL #1   Title Pt will be independent with her initial HEP to improve posture, strength, and improve ability to chew more comfortably.    Baseline Pt has started her initial HEP (04/01/2022)    Time 3    Period Weeks    Status New    Target Date 04/25/22               PT Long Term Goals - 04/01/22 1825       PT LONG TERM GOAL #1   Title Pt will improve her FOTO score by at least 10 points as a demonstration of improved function and ability to chew more comfortably.    Baseline orofacial FOTO 40 (04/01/2022)    Time 8    Period Weeks    Status New    Target Date 05/30/22      PT  LONG TERM GOAL #2   Title Pt  will improve her B scapular strength by at least 1/2 MMT grade to promote better scapular and cervical posture to promote better activation and mechanics of her jaw muscles.    Baseline Manually resisted scapular retraction targeting lower trap: 4/5 R and L (04/01/2022)    Time 8    Period Weeks    Status New    Target Date 05/30/22      PT LONG TERM GOAL #3   Title Pt will report ability to chew harder foods such as steak more comfortably to promote ability to eat a wider variety of food.    Baseline Pt currently eating soft foods secondary to her condition (04/01/2022)    Time 8    Period Weeks    Status New    Target Date 05/30/22                    Plan - 04/01/22 1359     Clinical Impression Statement Pt is a 71 year old female who came to physical therapy secondary to temporomandibular joint pain. She also demonstrates R jaw deviation, decreased jaw control; jaw, cervical, and B scapular weakness, poor posture, and difficulty chewing due to pain. Pt will benefit from skilled physical therapy services to address the aforementioned deficits.    Personal Factors and Comorbidities Comorbidity 3+;Age;Time since onset of injury/illness/exacerbation;Fitness    Comorbidities HTN, anxiety, dysrhythmia, bruises easily  ?    Examination-Activity Limitations Self Feeding    Stability/Clinical Decision Making Stable/Uncomplicated    Clinical Decision Making Low    Rehab Potential Fair    PT Frequency 2x / week    PT Duration 8 weeks    PT Treatment/Interventions Therapeutic exercise;Therapeutic activities;Neuromuscular re-education;Patient/family education;Manual techniques;Dry needling;Electrical Stimulation;Iontophoresis 4mg /ml Dexamethasone    PT Next Visit Plan posture, cervical, scapular, jaw strengthening, jaw control, manual techniques, modalities PRN    PT Home Exercise Plan medbridge Access Code: [email or web address]    Consulted and Agree with Plan of Care Patient              Patient will benefit from skilled therapeutic intervention in order to improve the following deficits and impairments:  Pain, Postural dysfunction, Improper body mechanics, Decreased strength  Visit Diagnosis: Muscle weakness (generalized)  Abnormal posture     Problem List Patient Active Problem List   Diagnosis Date Noted   Acute colitis 09/09/2019   Hypokalemia 09/09/2019   Dehydration 09/09/2019   Status post total knee replacement using cement, 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 [place], [address] Phone: [phone]   Fax:  [phone]  Name: Myrtice Scavetta MRN: [id number] Date of Birth: 1951-07-06

## 2022-04-02 NOTE — Addendum Note (Signed)
Addended by: Charlene Brooke on: 04/02/2022 01:15 PM   Modules accepted: Orders

## 2022-04-04 ENCOUNTER — Ambulatory Visit: Payer: Medicare HMO

## 2022-04-04 DIAGNOSIS — M6281 Muscle weakness (generalized): Secondary | ICD-10-CM | POA: Diagnosis not present

## 2022-04-04 DIAGNOSIS — R293 Abnormal posture: Secondary | ICD-10-CM | POA: Diagnosis not present

## 2022-04-04 NOTE — Therapy (Signed)
OUTPATIENT PHYSICAL THERAPY TREATMENT NOTE   Patient Name: Jodi Goodman MRN: [id number] DOB:[birth year], 71 y.o., female Today's Date: 04/04/2022  PCP: Sherron Monday, MD REFERRING PROVIDER: Jeannette How, DMD   PT End of Session - 04/04/22 1506     Visit Number 2    Number of Visits 17    Date for PT Re-Evaluation 05/30/22    Authorization Type 2    Authorization Time Period 10    PT Start Time 1506    PT Stop Time 1548    PT Time Calculation (min) 42 min    Activity Tolerance Patient tolerated treatment well    Behavior During Therapy WFL for tasks assessed/performed             Past Medical History:  Diagnosis Date   Anemia    H/O   Anxiety    Arthritis    Bruises easily    Dysrhythmia    PALPITATIONS   GERD (gastroesophageal reflux disease)    Hyperlipidemia    Hypertension    Past Surgical History:  Procedure Laterality Date   CESAREAN SECTION     COLONOSCOPY     EYE SURGERY Bilateral    laser to drain fluid   KNEE ARTHROSCOPY WITH MENISCAL REPAIR Left 09/26/2015   Procedure: KNEE ARTHROSCOPY WITH partial lateral menisectomy and abrasion chondoplasty of patella.;  Surgeon: Christena Flake, MD;  Location: [facility];  Service: Orthopedics;  Laterality: Left;   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   Procedure: TOTAL KNEE ARTHROPLASTY;  Surgeon: Christena Flake, MD;  Location: [facility];  Service: Orthopedics;  Laterality: Left;   WISDOM TOOTH EXTRACTION     Patient Active Problem List   Diagnosis Date Noted   Acute colitis 09/09/2019   Hypokalemia 09/09/2019   Dehydration 09/09/2019   Status post total knee replacement using cement, left 08/10/2019   Chronic pain of left knee 02/11/2019   Chronic pain syndrome 02/11/2019   Primary osteoarthritis of left knee 05/08/2015    REFERRING DIAG: TMJ click  THERAPY DIAG:  Muscle weakness (generalized)  Abnormal posture  Rationale for Evaluation and Treatment Rehabilitation  PERTINENT HISTORY: TMJ. Pain  began gradually about 1 year ago. R temporomandibular joint bothers her. Was told that she has subluxation and loose joint. Choked one time and had to do the heimlich maneuver on herself. Has been eating soft foods since her jaw problem. Pain has gotten a little bit better after the dentist adjusted her denture. Clenches her teeth at night while sleeping. Feels like her jaw muscles are so slack that she has a hard time chewing.  PRECAUTIONS: No known precautions  SUBJECTIVE: The back of her neck is sore from doing the chin tucks and shoulder blade squeezes. Something is irritating her throat.   PAIN:  Are you having pain? No     TODAY'S TREATMENT:   Manual therapy Seated STM B cervical paraspinal and upper trap muscles to decrease tension Decreased neck soreness afterwards      Therapeutic exercise   Seated B scapular retraction 10x5 seconds for 3 sets  Seated manually resisted L lateral cervical shift isometrics to promote more neutral neck position 10x5 seconds for 3 sets   Seated chin tucks with PT assist for proper neck positioning 10x5 seocnds for 2sets   Then with B scapular retraction 10x5 seconds      Seated gentle manually resisted L jaw deviation isometrics 10x5 seconds  Improved exercise technique, movement at target joints, use of target muscles after mod verbal, visual, tactile cues.         Response to treatment Pt tolerated session well without aggravation of symptoms. Decreased neck pain        Clinical impression   Worked on improving cervical and scapular strength to promote better foundation for her jaw muscles to function. Decreased neck soreness reported after treatment. Pt will benefit from continued skilled physical therapy services to improve posture, strength, and ability to chew her food.      PATIENT EDUCATION: Education details: there-ex Person educated: Patient Education method: Explanation, Demonstration, Actor  cues, and Verbal cues Education comprehension: verbalized understanding and returned demonstration   HOME EXERCISE PROGRAM:    Access Code: [email or web address] URL: [email or web address] Date: 04/01/2022 Prepared by: Loralyn Freshwater   Exercises - Seated Cervical Retraction  - 1 x daily - 7 x weekly - 3 sets - 10 reps - 5 seconds hold - Isometric Jaw Deviation  - 1 x daily - 7 x weekly - 3 sets - 10 reps - 5 seconds hold - Seated Scapular Retraction  - 1 x daily - 7 x weekly - 3 sets - 10 reps - 5 seconds  hold   PT Short Term Goals - 04/01/22 1822       PT SHORT TERM GOAL #1   Title Pt will be independent with her initial HEP to improve posture, strength, and improve ability to chew more comfortably.    Baseline Pt has started her initial HEP (04/01/2022)    Time 3    Period Weeks    Status New    Target Date 04/25/22              PT Long Term Goals - 04/01/22 1825       PT LONG TERM GOAL #1   Title Pt will improve her FOTO score by at least 10 points as a demonstration of improved function and ability to chew more comfortably.    Baseline orofacial FOTO 40 (04/01/2022)    Time 8    Period Weeks    Status New    Target Date 05/30/22      PT LONG TERM GOAL #2   Title Pt will improve her B scapular strength by at least 1/2 MMT grade to promote better scapular and cervical posture to promote better activation and mechanics of her jaw muscles.    Baseline Manually resisted scapular retraction targeting lower trap: 4/5 R and L (04/01/2022)    Time 8    Period Weeks    Status New    Target Date 05/30/22      PT LONG TERM GOAL #3   Title Pt will report ability to chew harder foods such as steak more comfortably to promote ability to eat a wider variety of food.    Baseline Pt currently eating soft foods secondary to her condition (04/01/2022)    Time 8    Period Weeks    Status New    Target Date 05/30/22              Plan - 04/04/22 1738     Clinical  Impression Statement Worked on improving cervical and scapular strength to promote better foundation for her jaw muscles to function. Decreased neck soreness reported after treatment. Pt will benefit from continued skilled physical therapy services to improve posture, strength, and ability to chew her food.    Personal Factors and Comorbidities Comorbidity  3+;Age;Time since onset of injury/illness/exacerbation;Fitness    Comorbidities HTN, anxiety, dysrhythmia, bruises easily  ?    Examination-Activity Limitations Self Feeding    Stability/Clinical Decision Making Stable/Uncomplicated    Rehab Potential Fair    PT Frequency 2x / week    PT Duration 8 weeks    PT Treatment/Interventions Therapeutic exercise;Therapeutic activities;Neuromuscular re-education;Patient/family education;Manual techniques;Dry needling;Electrical Stimulation;Iontophoresis 4mg /ml Dexamethasone    PT Next Visit Plan posture, cervical, scapular, jaw strengthening, jaw control, manual techniques, modalities PRN    PT Home Exercise Plan medbridge Access Code: [email or web address]    Consulted and Agree with Plan of Care Patient             PT, DPT   04/04/2022, 5:44 PM

## 2022-04-05 ENCOUNTER — Ambulatory Visit
Admission: RE | Admit: 2022-04-05 | Discharge: 2022-04-05 | Disposition: A | Discharge status: Home / Self Care | Payer: Medicare HMO | Source: Ambulatory Visit | Attending: Internal Medicine | Admitting: Internal Medicine

## 2022-04-05 DIAGNOSIS — Z1231 Encounter for screening mammogram for malignant neoplasm of breast: Secondary | ICD-10-CM | POA: Diagnosis not present

## 2022-04-09 ENCOUNTER — Ambulatory Visit: Payer: Medicare HMO

## 2022-04-09 DIAGNOSIS — M6281 Muscle weakness (generalized): Secondary | ICD-10-CM

## 2022-04-09 DIAGNOSIS — R293 Abnormal posture: Secondary | ICD-10-CM

## 2022-04-09 NOTE — Patient Instructions (Signed)
Gently open and close your jaw about 1 cm Keep jaw in neutral   Perform with chin tuck and shoulders back (Sit up straight)

## 2022-04-09 NOTE — Therapy (Signed)
OUTPATIENT PHYSICAL THERAPY TREATMENT NOTE   Patient Name: Jodi Goodman MRN: [id number] DOB:1951-02-26, 71 y.o., female Today's Date: 04/09/2022  PCP: Sherron Monday, MD REFERRING PROVIDER: Jeannette How, DMD   PT End of Session - 04/09/22 1015     Visit Number 3    Number of Visits 17    Date for PT Re-Evaluation 05/30/22    Authorization Type 3    Authorization Time Period 10    PT Start Time 1015    PT Stop Time 1101    PT Time Calculation (min) 46 min    Activity Tolerance Patient tolerated treatment well    Behavior During Therapy WFL for tasks assessed/performed              Past Medical History:  Diagnosis Date   Anemia    H/O   Anxiety    Arthritis    Bruises easily    Dysrhythmia    PALPITATIONS   GERD (gastroesophageal reflux disease)    Hyperlipidemia    Hypertension    Past Surgical History:  Procedure Laterality Date   CESAREAN SECTION     COLONOSCOPY     EYE SURGERY Bilateral    laser to drain fluid   KNEE ARTHROSCOPY WITH MENISCAL REPAIR Left 09/26/2015   Procedure: KNEE ARTHROSCOPY WITH partial lateral menisectomy and abrasion chondoplasty of patella.;  Surgeon: Christena Flake, MD;  Location: [facility];  Service: Orthopedics;  Laterality: Left;   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   Procedure: TOTAL KNEE ARTHROPLASTY;  Surgeon: Christena Flake, MD;  Location: [facility];  Service: Orthopedics;  Laterality: Left;   WISDOM TOOTH EXTRACTION     Patient Active Problem List   Diagnosis Date Noted   Acute colitis 09/09/2019   Hypokalemia 09/09/2019   Dehydration 09/09/2019   Status post total knee replacement using cement, left 08/10/2019   Chronic pain of left knee 02/11/2019   Chronic pain syndrome 02/11/2019   Primary osteoarthritis of left knee 05/08/2015    REFERRING DIAG: TMJ click  THERAPY DIAG:  Muscle weakness (generalized)  Abnormal posture  Rationale for Evaluation and Treatment Rehabilitation  PERTINENT HISTORY: TMJ.  Pain began gradually about 1 year ago. R temporomandibular joint bothers her. Was told that she has subluxation and loose joint. Choked one time and had to do the heimlich maneuver on herself. Has been eating soft foods since her jaw problem. Pain has gotten a little bit better after the dentist adjusted her denture. Clenches her teeth at night while sleeping. Feels like her jaw muscles are so slack that she has a hard time chewing.  PRECAUTIONS: No known precautions  SUBJECTIVE: feels a tinge of pain in her neck  PAIN:  Are you having pain? No jaw pain. 2-3/10 neck pain currently.      TODAY'S TREATMENT:    Therapeutic exercise   Seated manually resisted L lateral cervical shift isometrics to promote more neutral neck position 10x5 seconds for 1 sets  R S/L with pillow and towel roll under neck  R S/L L cervical side bend isometrics in neutral 5x5 seconds to decrease R cervical side bend posture  Supine chin tucks 10x5 seconds for 3 sets  Supine B shoulder abduction 10x2 with 5 second holds  Supine with jaw slightly open, neutral position  Gentle manual resistance/perturbation all planes, emphasis on neutral position 1 minute x 2  Supine gentle manually resisted L jaw deviation isometrics 10x5 seconds for 2 sets  Supine gentle jaw clench in  neutral 10x5 seconds for 2 sets  Seated gentle jaw opening and closing, neutral 10x2, emphasis on control and no deviation.   Mirror visual, as well as verbal cues utilized.       Improved exercise technique, movement at target joints, use of target muscles after mod verbal, visual, tactile cues.         Response to treatment Pt tolerated session well without aggravation of symptoms.        Clinical impression    Continued working on improving cervical and scapular posture to promote better foundation for jaw muscles to function. Also worked on gentle jaw strengthening and decreasing jaw deviation when opening and closing her  mouth. Small distance of mouth opening performed secondary to difficulty with preventing jaw deviation. Pt tolerated session well without aggravation of symptoms.  Pt will benefit from continued skilled physical therapy services to improve posture, strength, and ability to chew her food.      PATIENT EDUCATION: Education details: there-ex Person educated: Patient Education method: Explanation, Demonstration, Actor cues, and Verbal cues Education comprehension: verbalized understanding and returned demonstration   HOME EXERCISE PROGRAM:    Access Code: [email or web address] URL: [email or web address] Date: 04/01/2022 Prepared by: Loralyn Freshwater   Exercises - Seated Cervical Retraction  - 1 x daily - 7 x weekly - 3 sets - 10 reps - 5 seconds hold - Isometric Jaw Deviation  - 1 x daily - 7 x weekly - 3 sets - 10 reps - 5 seconds hold - Seated Scapular Retraction  - 1 x daily - 7 x weekly - 3 sets - 10 reps - 5 seconds  hold - Supine Shoulder Horizontal Abduction with Dumbbells  - 1 x daily - 7 x weekly - 3 sets - 10 reps - 5 seconds hold - Supine Cervical Retraction with Towel  - 1 x daily - 7 x weekly - 3 sets - 10 reps - 5 seconds hold -Gently open and close your jaw about 1 cm Keep jaw in neutral   Perform with chin tuck and shoulders back (Sit up straight)      PT Short Term Goals - 04/01/22 1822       PT SHORT TERM GOAL #1   Title Pt will be independent with her initial HEP to improve posture, strength, and improve ability to chew more comfortably.    Baseline Pt has started her initial HEP (04/01/2022)    Time 3    Period Weeks    Status New    Target Date 04/25/22              PT Long Term Goals - 04/01/22 1825       PT LONG TERM GOAL #1   Title Pt will improve her FOTO score by at least 10 points as a demonstration of improved function and ability to chew more comfortably.    Baseline orofacial FOTO 40 (04/01/2022)    Time 8    Period Weeks    Status  New    Target Date 05/30/22      PT LONG TERM GOAL #2   Title Pt will improve her B scapular strength by at least 1/2 MMT grade to promote better scapular and cervical posture to promote better activation and mechanics of her jaw muscles.    Baseline Manually resisted scapular retraction targeting lower trap: 4/5 R and L (04/01/2022)    Time 8    Period Weeks    Status New    Target Date  05/30/22      PT LONG TERM GOAL #3   Title Pt will report ability to chew harder foods such as steak more comfortably to promote ability to eat a wider variety of food.    Baseline Pt currently eating soft foods secondary to her condition (04/01/2022)    Time 8    Period Weeks    Status New    Target Date 05/30/22              Plan - 04/09/22 1313     Clinical Impression Statement Continued working on improving cervical and scapular posture to promote better foundation for jaw muscles to function. Also worked on gentle jaw strengthening and decreasing jaw deviation when opening and closing her mouth. Small distance of mouth opening performed secondary to difficulty with preventing jaw deviation. Pt tolerated session well without aggravation of symptoms.  Pt will benefit from continued skilled physical therapy services to improve posture, strength, and ability to chew her food.    Personal Factors and Comorbidities Comorbidity 3+;Age;Time since onset of injury/illness/exacerbation;Fitness    Comorbidities HTN, anxiety, dysrhythmia, bruises easily  ?    Examination-Activity Limitations Self Feeding    Stability/Clinical Decision Making Stable/Uncomplicated    Rehab Potential Fair    PT Frequency 2x / week    PT Duration 8 weeks    PT Treatment/Interventions Therapeutic exercise;Therapeutic activities;Neuromuscular re-education;Patient/family education;Manual techniques;Dry needling;Electrical Stimulation;Iontophoresis 4mg /ml Dexamethasone    PT Next Visit Plan posture, cervical, scapular, jaw  strengthening, jaw control, manual techniques, modalities PRN    PT Home Exercise Plan medbridge Access Code: [email or web address]    Consulted and Agree with Plan of Care Patient              PT, DPT   04/09/2022, 1:19 PM

## 2022-04-15 ENCOUNTER — Telehealth: Payer: Self-pay

## 2022-04-15 ENCOUNTER — Ambulatory Visit: Payer: Medicare HMO

## 2022-04-16 ENCOUNTER — Ambulatory Visit: Payer: Medicare HMO

## 2022-04-16 DIAGNOSIS — R293 Abnormal posture: Secondary | ICD-10-CM | POA: Diagnosis not present

## 2022-04-16 DIAGNOSIS — M6281 Muscle weakness (generalized): Secondary | ICD-10-CM | POA: Diagnosis not present

## 2022-04-22 ENCOUNTER — Ambulatory Visit: Payer: Medicare HMO | Attending: Dentistry

## 2022-04-22 DIAGNOSIS — M6281 Muscle weakness (generalized): Secondary | ICD-10-CM | POA: Diagnosis not present

## 2022-04-22 DIAGNOSIS — R293 Abnormal posture: Secondary | ICD-10-CM | POA: Insufficient documentation

## 2022-04-22 NOTE — Therapy (Signed)
OUTPATIENT PHYSICAL THERAPY TREATMENT NOTE   Patient Name: Jodi Goodman MRN: [id number] DOB:October 26, 1950, 71 y.o., female Today's Date: 04/22/2022  PCP: Sherron Monday, MD REFERRING PROVIDER: Jeannette How, DMD   PT End of Session - 04/22/22 1627     Visit Number 5    Number of Visits 17    Date for PT Re-Evaluation 05/30/22    Authorization Type 5    Authorization Time Period 10    PT Start Time 1627    PT Stop Time 1715    PT Time Calculation (min) 48 min    Activity Tolerance Patient tolerated treatment well    Behavior During Therapy WFL for tasks assessed/performed                Past Medical History:  Diagnosis Date   Anemia    H/O   Anxiety    Arthritis    Bruises easily    Dysrhythmia    PALPITATIONS   GERD (gastroesophageal reflux disease)    Hyperlipidemia    Hypertension    Past Surgical History:  Procedure Laterality Date   CESAREAN SECTION     COLONOSCOPY     EYE SURGERY Bilateral    laser to drain fluid   KNEE ARTHROSCOPY WITH MENISCAL REPAIR Left 09/26/2015   Procedure: KNEE ARTHROSCOPY WITH partial lateral menisectomy and abrasion chondoplasty of patella.;  Surgeon: Christena Flake, MD;  Location: [facility];  Service: Orthopedics;  Laterality: Left;   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   Procedure: TOTAL KNEE ARTHROPLASTY;  Surgeon: Christena Flake, MD;  Location: [facility];  Service: Orthopedics;  Laterality: Left;   WISDOM TOOTH EXTRACTION     Patient Active Problem List   Diagnosis Date Noted   Acute colitis 09/09/2019   Hypokalemia 09/09/2019   Dehydration 09/09/2019   Status post total knee replacement using cement, left 08/10/2019   Chronic pain of left knee 02/11/2019   Chronic pain syndrome 02/11/2019   Primary osteoarthritis of left knee 05/08/2015    REFERRING DIAG: TMJ click  THERAPY DIAG:  Muscle weakness (generalized)  Abnormal posture  Rationale for Evaluation and Treatment Rehabilitation  PERTINENT HISTORY: TMJ.  Pain began gradually about 1 year ago. R temporomandibular joint bothers her. Was told that she has subluxation and loose joint. Choked one time and had to do the heimlich maneuver on herself. Has been eating soft foods since her jaw problem. Pain has gotten a little bit better after the dentist adjusted her denture. Clenches her teeth at night while sleeping. Feels like her jaw muscles are so slack that she has a hard time chewing.  PRECAUTIONS: No known precautions  SUBJECTIVE: Neck is sore a little bit, not as bad. Pt eating soft foods.   PAIN:  Are you having pain? No jaw pain. 4-5/10 neck pain currently.      TODAY'S TREATMENT:    Manual therapy Seated STM L and R lateral pterygoid muscles   Pt education on locating the pterygoid muscles, pt was recommended to work on L pterygoid to decrease R lateral jaw deviation  Pt demonstrated and verbalized understanding.     Therapeutic exercise   With gloves on, pt gently biting on PT finger (to assess how pt chews) and to strengthen muscles of mastication due to weakness  Seated manually resisted jaw L lateral shift isometrics in neutral then with closing mouth 10x4 with 5 seconds to help decrease R lateral jaw deviation.    Seated manual jaw perturbation all planes  with jaw in slight opening 30 seconds x 3 to promote stability  Jaw opening and closing with emphasis on neutral position 5x6. Decreased control on 6th repetition  Reviewed and given as part of her HEP. Pt demonstrated and verbalized understanding.   Increased R lateral shift of jaw at 6th repetition         Improved exercise technique, movement at target joints, use of target muscles after mod verbal, visual, tactile cues.         Response to treatment Pt tolerated session well without aggravation of symptoms.        Clinical impression   Pt continues to demonstrate jaw R lateral deviation when opening her mouth. Worked on decreasing L lateral pterygoid  muscle tension, improving R lateral pterygoid strength as well as promoting jaw stability and motor control. Difficulty with jaw motor control and endurance with maintaining proper jaw movement observed. Pt tolerated session well without aggravation of symptoms. Pt will benefit from continued skilled physical therapy services to improve posture, strength, and ability to chew her food.      PATIENT EDUCATION: Education details: there-ex Person educated: Patient Education method: Explanation, Demonstration, Actor cues, and Verbal cues Education comprehension: verbalized understanding and returned demonstration   HOME EXERCISE PROGRAM:    Access Code: [email or web address] URL: [email or web address] Date: 04/01/2022 Prepared by: Loralyn Freshwater   Exercises - Seated Cervical Retraction  - 1 x daily - 7 x weekly - 3 sets - 10 reps - 5 seconds hold - Isometric Jaw Deviation  - 1 x daily - 7 x weekly - 3 sets - 10 reps - 5 seconds hold - Seated Scapular Retraction  - 1 x daily - 7 x weekly - 3 sets - 10 reps - 5 seconds  hold - Supine Shoulder Horizontal Abduction with Dumbbells  - 1 x daily - 7 x weekly - 3 sets - 10 reps - 5 seconds hold - Supine Cervical Retraction with Towel  - 1 x daily - 7 x weekly - 3 sets - 10 reps - 5 seconds hold -Gently open and close your jaw about 1 cm Keep jaw in neutral   Perform with chin tuck and shoulders back (Sit up straight)      PT Short Term Goals - 04/01/22 1822       PT SHORT TERM GOAL #1   Title Pt will be independent with her initial HEP to improve posture, strength, and improve ability to chew more comfortably.    Baseline Pt has started her initial HEP (04/01/2022)    Time 3    Period Weeks    Status New    Target Date 04/25/22              PT Long Term Goals - 04/01/22 1825       PT LONG TERM GOAL #1   Title Pt will improve her FOTO score by at least 10 points as a demonstration of improved function and ability to chew  more comfortably.    Baseline orofacial FOTO 40 (04/01/2022)    Time 8    Period Weeks    Status New    Target Date 05/30/22      PT LONG TERM GOAL #2   Title Pt will improve her B scapular strength by at least 1/2 MMT grade to promote better scapular and cervical posture to promote better activation and mechanics of her jaw muscles.    Baseline Manually resisted scapular retraction targeting lower trap: 4/5 R  and L (04/01/2022)    Time 8    Period Weeks    Status New    Target Date 05/30/22      PT LONG TERM GOAL #3   Title Pt will report ability to chew harder foods such as steak more comfortably to promote ability to eat a wider variety of food.    Baseline Pt currently eating soft foods secondary to her condition (04/01/2022)    Time 8    Period Weeks    Status New    Target Date 05/30/22              Plan - 04/22/22 1626     Clinical Impression Statement Pt continues to demonstrate jaw R lateral deviation when opening her mouth. Worked on decreasing L lateral pterygoid muscle tension, improving R lateral pterygoid strength as well as promoting jaw stability and motor control. Difficulty with jaw motor control and endurance with maintaining proper jaw movement observed. Pt tolerated session well without aggravation of symptoms. Pt will benefit from continued skilled physical therapy services to improve posture, strength, and ability to chew her food.    Personal Factors and Comorbidities Comorbidity 3+;Age;Time since onset of injury/illness/exacerbation;Fitness    Comorbidities HTN, anxiety, dysrhythmia, bruises easily  ?    Examination-Activity Limitations Self Feeding    Stability/Clinical Decision Making Stable/Uncomplicated    Rehab Potential Fair    PT Frequency 2x / week    PT Duration 8 weeks    PT Treatment/Interventions Therapeutic exercise;Therapeutic activities;Neuromuscular re-education;Patient/family education;Manual techniques;Dry needling;Electrical  Stimulation;Iontophoresis 4mg /ml Dexamethasone    PT Next Visit Plan posture, cervical, scapular, jaw strengthening, jaw control, manual techniques, modalities PRN    PT Home Exercise Plan medbridge Access Code: [email or web address]    Consulted and Agree with Plan of Care Patient                PT, DPT   04/22/2022, 6:19 PM

## 2022-04-30 ENCOUNTER — Ambulatory Visit: Payer: Medicare HMO

## 2022-04-30 DIAGNOSIS — R293 Abnormal posture: Secondary | ICD-10-CM | POA: Diagnosis not present

## 2022-04-30 DIAGNOSIS — M6281 Muscle weakness (generalized): Secondary | ICD-10-CM | POA: Diagnosis not present

## 2022-04-30 NOTE — Therapy (Signed)
OUTPATIENT PHYSICAL THERAPY TREATMENT NOTE   Patient Name: Jodi Goodman MRN: [id number] DOB:[birth year], 71 y.o., female Today's Date: 04/30/2022  PCP: Sherron Monday, MD REFERRING PROVIDER: Jeannette How, DMD   PT End of Session - 04/30/22 1507     Visit Number 6    Number of Visits 17    Date for PT Re-Evaluation 05/30/22    Authorization Type 6    Authorization Time Period 10    PT Start Time 1507    PT Stop Time 1551    PT Time Calculation (min) 44 min    Activity Tolerance Patient tolerated treatment well    Behavior During Therapy WFL for tasks assessed/performed                 Past Medical History:  Diagnosis Date   Anemia    H/O   Anxiety    Arthritis    Bruises easily    Dysrhythmia    PALPITATIONS   GERD (gastroesophageal reflux disease)    Hyperlipidemia    Hypertension    Past Surgical History:  Procedure Laterality Date   CESAREAN SECTION     COLONOSCOPY     EYE SURGERY Bilateral    laser to drain fluid   KNEE ARTHROSCOPY WITH MENISCAL REPAIR Left 09/26/2015   Procedure: KNEE ARTHROSCOPY WITH partial lateral menisectomy and abrasion chondoplasty of patella.;  Surgeon: Christena Flake, MD;  Location: [facility];  Service: Orthopedics;  Laterality: Left;   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   Procedure: TOTAL KNEE ARTHROPLASTY;  Surgeon: Christena Flake, MD;  Location: [facility];  Service: Orthopedics;  Laterality: Left;   WISDOM TOOTH EXTRACTION     Patient Active Problem List   Diagnosis Date Noted   Acute colitis 09/09/2019   Hypokalemia 09/09/2019   Dehydration 09/09/2019   Status post total knee replacement using cement, left 08/10/2019   Chronic pain of left knee 02/11/2019   Chronic pain syndrome 02/11/2019   Primary osteoarthritis of left knee 05/08/2015    REFERRING DIAG: TMJ click  THERAPY DIAG:  Muscle weakness (generalized)  Abnormal posture  Rationale for Evaluation and Treatment Rehabilitation  PERTINENT HISTORY:  TMJ. Pain began gradually about 1 year ago. R temporomandibular joint bothers her. Was told that she has subluxation and loose joint. Choked one time and had to do the heimlich maneuver on herself. Has been eating soft foods since her jaw problem. Pain has gotten a little bit better after the dentist adjusted her denture. Clenches her teeth at night while sleeping. Feels like her jaw muscles are so slack that she has a hard time chewing.  PRECAUTIONS: No known precautions  SUBJECTIVE: Neck is good. Still not chewing. Lower jaw shifts forward when she tries to chew.    PAIN:  Are you having pain? 0/10     TODAY'S TREATMENT:    Manual therapy Seated STM L lateral pterygoid muscles   Pt education on locating the pterygoid muscles, pt was recommended to work on L pterygoid to decrease R lateral jaw deviation  Pt demonstrated and verbalized understanding.     Therapeutic exercise   With gloves on, pt gently biting on PT finger (to assess how pt chews) and to strengthen muscles of mastication due to weakness. 5x5 seconds for 4 sets  Seated manually resisted jaw L lateral shift isometrics in neutral then with closing mouth 10x4 with 5 seconds to help decrease R lateral jaw deviation.   Jaw opening and closing with emphasis on  neutral position 10x. Then 6x, then 10x  Able to perform one set of 10 repetitions today.   Seated manually resisted L jaw lateral shift isometrics in neutral 10x5 seconds     Seated manual jaw perturbation all planes (except R lateral shift)  with jaw in slight opening 30 seconds x 2 to promote stability         Improved exercise technique, movement at target joints, use of target muscles after mod verbal, visual, tactile cues.         Response to treatment Pt tolerated session well without aggravation of symptoms.        Clinical impression   Worked on decreasing L lateral pterygoid muscle tension, improving R lateral pterygoid strength as well  as promoting jaw stability and motor control. Difficulty with jaw motor control and endurance with maintaining proper jaw movement observed. Pt tolerated session well without aggravation of symptoms. Pt will benefit from continued skilled physical therapy services to improve posture, strength, and ability to chew her food.      PATIENT EDUCATION: Education details: there-ex Person educated: Patient Education method: Explanation, Demonstration, Actor cues, and Verbal cues Education comprehension: verbalized understanding and returned demonstration   HOME EXERCISE PROGRAM:    Access Code: [email or web address] URL: [email or web address] Date: 04/01/2022 Prepared by: Loralyn Freshwater   Exercises - Seated Cervical Retraction  - 1 x daily - 7 x weekly - 3 sets - 10 reps - 5 seconds hold - Isometric Jaw Deviation  - 1 x daily - 7 x weekly - 3 sets - 10 reps - 5 seconds hold - Seated Scapular Retraction  - 1 x daily - 7 x weekly - 3 sets - 10 reps - 5 seconds  hold - Supine Shoulder Horizontal Abduction with Dumbbells  - 1 x daily - 7 x weekly - 3 sets - 10 reps - 5 seconds hold - Supine Cervical Retraction with Towel  - 1 x daily - 7 x weekly - 3 sets - 10 reps - 5 seconds hold -Gently open and close your jaw about 1 cm Keep jaw in neutral   Perform with chin tuck and shoulders back (Sit up straight)      PT Short Term Goals - 04/01/22 1822       PT SHORT TERM GOAL #1   Title Pt will be independent with her initial HEP to improve posture, strength, and improve ability to chew more comfortably.    Baseline Pt has started her initial HEP (04/01/2022)    Time 3    Period Weeks    Status New    Target Date 04/25/22              PT Long Term Goals - 04/01/22 1825       PT LONG TERM GOAL #1   Title Pt will improve her FOTO score by at least 10 points as a demonstration of improved function and ability to chew more comfortably.    Baseline orofacial FOTO 40 (04/01/2022)     Time 8    Period Weeks    Status New    Target Date 05/30/22      PT LONG TERM GOAL #2   Title Pt will improve her B scapular strength by at least 1/2 MMT grade to promote better scapular and cervical posture to promote better activation and mechanics of her jaw muscles.    Baseline Manually resisted scapular retraction targeting lower trap: 4/5 R and L (04/01/2022)    Time  8    Period Weeks    Status New    Target Date 05/30/22      PT LONG TERM GOAL #3   Title Pt will report ability to chew harder foods such as steak more comfortably to promote ability to eat a wider variety of food.    Baseline Pt currently eating soft foods secondary to her condition (04/01/2022)    Time 8    Period Weeks    Status New    Target Date 05/30/22              Plan - 04/30/22 1600     Clinical Impression Statement Worked on decreasing L lateral pterygoid muscle tension, improving R lateral pterygoid strength as well as promoting jaw stability and motor control. Difficulty with jaw motor control and endurance with maintaining proper jaw movement observed. Pt tolerated session well without aggravation of symptoms. Pt will benefit from continued skilled physical therapy services to improve posture, strength, and ability to chew her food.    Personal Factors and Comorbidities Comorbidity 3+;Age;Time since onset of injury/illness/exacerbation;Fitness    Comorbidities HTN, anxiety, dysrhythmia, bruises easily  ?    Examination-Activity Limitations Self Feeding    Stability/Clinical Decision Making Stable/Uncomplicated    Rehab Potential Fair    PT Frequency 2x / week    PT Duration 8 weeks    PT Treatment/Interventions Therapeutic exercise;Therapeutic activities;Neuromuscular re-education;Patient/family education;Manual techniques;Dry needling;Electrical Stimulation;Iontophoresis 4mg /ml Dexamethasone    PT Next Visit Plan posture, cervical, scapular, jaw strengthening, jaw control, manual techniques,  modalities PRN    PT Home Exercise Plan medbridge Access Code: [email or web address]    Consulted and Agree with Plan of Care Patient                 PT, DPT   04/30/2022, 4:02 PM

## 2022-05-06 ENCOUNTER — Ambulatory Visit: Payer: Medicare HMO

## 2022-05-06 NOTE — Therapy (Signed)
Pt arrives for session, reports still working on HEP as issued, still unable to perform any chewing. Pt is scheduled for reassessment with evaluating therapist next visit. Pt/author decides to pass on today's visit and follow up with evaluating therapist at next session. No charge for this visit.   5:38 PM, 05/06/22 Rosamaria Lints, PT, DPT Physical Therapist - [place] [phone] (Office)

## 2022-05-08 ENCOUNTER — Ambulatory Visit: Payer: Medicare HMO

## 2022-05-08 DIAGNOSIS — M6281 Muscle weakness (generalized): Secondary | ICD-10-CM

## 2022-05-08 DIAGNOSIS — R293 Abnormal posture: Secondary | ICD-10-CM | POA: Diagnosis not present

## 2022-05-08 NOTE — Therapy (Signed)
OUTPATIENT PHYSICAL THERAPY TREATMENT NOTE   Patient Name: Jodi Goodman MRN: [id number] DOB:April 28, 1951, 71 y.o., female Today's Date: 05/08/2022  PCP: Sherron Monday, MD REFERRING PROVIDER: Jeannette How, DMD   PT End of Session - 05/08/22 1635     Visit Number 7    Number of Visits 17    Date for PT Re-Evaluation 05/30/22    Authorization Type 7    Authorization Time Period 10    PT Start Time 1635    PT Stop Time 1727    PT Time Calculation (min) 52 min    Activity Tolerance Patient tolerated treatment well    Behavior During Therapy WFL for tasks assessed/performed                  Past Medical History:  Diagnosis Date   Anemia    H/O   Anxiety    Arthritis    Bruises easily    Dysrhythmia    PALPITATIONS   GERD (gastroesophageal reflux disease)    Hyperlipidemia    Hypertension    Past Surgical History:  Procedure Laterality Date   CESAREAN SECTION     COLONOSCOPY     EYE SURGERY Bilateral    laser to drain fluid   KNEE ARTHROSCOPY WITH MENISCAL REPAIR Left 09/26/2015   Procedure: KNEE ARTHROSCOPY WITH partial lateral menisectomy and abrasion chondoplasty of patella.;  Surgeon: Christena Flake, MD;  Location: [facility];  Service: Orthopedics;  Laterality: Left;   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   Procedure: TOTAL KNEE ARTHROPLASTY;  Surgeon: Christena Flake, MD;  Location: [facility];  Service: Orthopedics;  Laterality: Left;   WISDOM TOOTH EXTRACTION     Patient Active Problem List   Diagnosis Date Noted   Acute colitis 09/09/2019   Hypokalemia 09/09/2019   Dehydration 09/09/2019   Status post total knee replacement using cement, left 08/10/2019   Chronic pain of left knee 02/11/2019   Chronic pain syndrome 02/11/2019   Primary osteoarthritis of left knee 05/08/2015    REFERRING DIAG: TMJ click  THERAPY DIAG:  Muscle weakness (generalized)  Abnormal posture  Rationale for Evaluation and Treatment Rehabilitation  PERTINENT HISTORY:  TMJ. Pain began gradually about 1 year ago. R temporomandibular joint bothers her. Was told that she has subluxation and loose joint. Choked one time and had to do the heimlich maneuver on herself. Has been eating soft foods since her jaw problem. Pain has gotten a little bit better after the dentist adjusted her denture. Clenches her teeth at night while sleeping. Feels like her jaw muscles are so slack that she has a hard time chewing.  PRECAUTIONS: No known precautions  SUBJECTIVE:  The jaw goes forward when she tires to chew. Jaw forgot how to chew side to side. Progress is "kind of sort of." Jaw feels very loose. Dentist did not mention another plan if PT did not work.         PAIN:  Are you having pain? 0/10     TODAY'S TREATMENT:    Manual therapy   Supine STM R masseter muscle to decrease tension   Supine STM R cervical paraspinal muscles to decrease tension.      Therapeutic exercise   Supine manually resisted jaw L lateral shift isometrics in neutral 10x4 with 5 second holds    then with closing mouth 10x3 with 5 seconds to help decrease R lateral jaw deviation.   Supine chin tucks 10x5 seconds for 3 sets  Supine manually  resisted jaw L lateral shift isometrics, mouth closed 10x5 seconds for 2 sets  Recommended for pt to use her finger inside L side on mouth to gently activate her L masseter muscle to improve strength. Pt verbalized understanding.        Improved exercise technique, movement at target joints, use of target muscles after mod verbal, visual, tactile cues.         Response to treatment Pt tolerated session well without aggravation of symptoms.        Clinical impression   Continued working on improving jaw control and decreasing R lateral and anterior deviation when opening and closing her mouth. Worked on decreasing R masseter muscle tension secondary to pt tendency to only chew with the R side of her mouth. Pt tolerated session well  without aggravation of symptoms. Pt will benefit from continued skilled physical therapy services to improve posture, strength, and ability to chew her food.      PATIENT EDUCATION: Education details: there-ex Person educated: Patient Education method: Explanation, Demonstration, Actor cues, and Verbal cues Education comprehension: verbalized understanding and returned demonstration   HOME EXERCISE PROGRAM:    Access Code: [email or web address] URL: [email or web address] Date: 04/01/2022 Prepared by: Loralyn Freshwater   Exercises - Seated Cervical Retraction  - 1 x daily - 7 x weekly - 3 sets - 10 reps - 5 seconds hold - Isometric Jaw Deviation  - 1 x daily - 7 x weekly - 3 sets - 10 reps - 5 seconds hold - Seated Scapular Retraction  - 1 x daily - 7 x weekly - 3 sets - 10 reps - 5 seconds  hold - Supine Shoulder Horizontal Abduction with Dumbbells  - 1 x daily - 7 x weekly - 3 sets - 10 reps - 5 seconds hold - Supine Cervical Retraction with Towel  - 1 x daily - 7 x weekly - 3 sets - 10 reps - 5 seconds hold -Gently open and close your jaw about 1 cm Keep jaw in neutral   Perform with chin tuck and shoulders back (Sit up straight)      PT Short Term Goals - 04/01/22 1822       PT SHORT TERM GOAL #1   Title Pt will be independent with her initial HEP to improve posture, strength, and improve ability to chew more comfortably.    Baseline Pt has started her initial HEP (04/01/2022)    Time 3    Period Weeks    Status New    Target Date 04/25/22              PT Long Term Goals - 04/01/22 1825       PT LONG TERM GOAL #1   Title Pt will improve her FOTO score by at least 10 points as a demonstration of improved function and ability to chew more comfortably.    Baseline orofacial FOTO 40 (04/01/2022)    Time 8    Period Weeks    Status New    Target Date 05/30/22      PT LONG TERM GOAL #2   Title Pt will improve her B scapular strength by at least 1/2 MMT grade  to promote better scapular and cervical posture to promote better activation and mechanics of her jaw muscles.    Baseline Manually resisted scapular retraction targeting lower trap: 4/5 R and L (04/01/2022)    Time 8    Period Weeks    Status New    Target  Date 05/30/22      PT LONG TERM GOAL #3   Title Pt will report ability to chew harder foods such as steak more comfortably to promote ability to eat a wider variety of food.    Baseline Pt currently eating soft foods secondary to her condition (04/01/2022)    Time 8    Period Weeks    Status New    Target Date 05/30/22              Plan - 05/08/22 1735     Clinical Impression Statement Continued working on improving jaw control and decreasing R lateral and anterior deviation when opening and closing her mouth. Worked on decreasing R masseter muscle tension secondary to pt tendency to only chew with the R side of her mouth. Pt tolerated session well without aggravation of symptoms. Pt will benefit from continued skilled physical therapy services to improve posture, strength, and ability to chew her food.    Personal Factors and Comorbidities Comorbidity 3+;Age;Time since onset of injury/illness/exacerbation;Fitness    Comorbidities HTN, anxiety, dysrhythmia, bruises easily  ?    Examination-Activity Limitations Self Feeding    Stability/Clinical Decision Making Stable/Uncomplicated    Rehab Potential Fair    PT Frequency 2x / week    PT Duration 8 weeks    PT Treatment/Interventions Therapeutic exercise;Therapeutic activities;Neuromuscular re-education;Patient/family education;Manual techniques;Dry needling;Electrical Stimulation;Iontophoresis 4mg /ml Dexamethasone    PT Next Visit Plan posture, cervical, scapular, jaw strengthening, jaw control, manual techniques, modalities PRN    PT Home Exercise Plan medbridge Access Code: [email or web address]    Consulted and Agree with Plan of Care Patient                  PT,  DPT   05/08/2022, 5:37 PM

## 2022-05-14 ENCOUNTER — Ambulatory Visit: Payer: Medicare HMO

## 2022-05-16 ENCOUNTER — Ambulatory Visit: Payer: Medicare HMO

## 2022-05-21 ENCOUNTER — Ambulatory Visit: Payer: Medicare HMO | Attending: Dentistry

## 2022-05-21 DIAGNOSIS — M6281 Muscle weakness (generalized): Secondary | ICD-10-CM | POA: Insufficient documentation

## 2022-05-21 DIAGNOSIS — R293 Abnormal posture: Secondary | ICD-10-CM | POA: Diagnosis not present

## 2022-05-21 NOTE — Therapy (Signed)
OUTPATIENT PHYSICAL THERAPY TREATMENT NOTE   Patient Name: Jodi Goodman MRN: [id number] DOB:04/27/1951, 71 y.o., female Today's Date: 05/21/2022  PCP: Sherron Monday, MD REFERRING PROVIDER: Jeannette How, DMD   PT End of Session - 05/21/22 1551     Visit Number 8    Number of Visits 17    Date for PT Re-Evaluation 05/30/22    Authorization Type 8    Authorization Time Period 10    PT Start Time 1551    PT Stop Time 1644    PT Time Calculation (min) 53 min    Activity Tolerance Patient tolerated treatment well    Behavior During Therapy WFL for tasks assessed/performed                   Past Medical History:  Diagnosis Date   Anemia    H/O   Anxiety    Arthritis    Bruises easily    Dysrhythmia    PALPITATIONS   GERD (gastroesophageal reflux disease)    Hyperlipidemia    Hypertension    Past Surgical History:  Procedure Laterality Date   CESAREAN SECTION     COLONOSCOPY     EYE SURGERY Bilateral    laser to drain fluid   KNEE ARTHROSCOPY WITH MENISCAL REPAIR Left 09/26/2015   Procedure: KNEE ARTHROSCOPY WITH partial lateral menisectomy and abrasion chondoplasty of patella.;  Surgeon: Christena Flake, MD;  Location: [facility];  Service: Orthopedics;  Laterality: Left;   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   Procedure: TOTAL KNEE ARTHROPLASTY;  Surgeon: Christena Flake, MD;  Location: [facility];  Service: Orthopedics;  Laterality: Left;   WISDOM TOOTH EXTRACTION     Patient Active Problem List   Diagnosis Date Noted   Acute colitis 09/09/2019   Hypokalemia 09/09/2019   Dehydration 09/09/2019   Status post total knee replacement using cement, left 08/10/2019   Chronic pain of left knee 02/11/2019   Chronic pain syndrome 02/11/2019   Primary osteoarthritis of left knee 05/08/2015    REFERRING DIAG: TMJ click  THERAPY DIAG:  Muscle weakness (generalized)  Abnormal posture  Rationale for Evaluation and Treatment Rehabilitation  PERTINENT  HISTORY: TMJ. Pain began gradually about 1 year ago. R temporomandibular joint bothers her. Was told that she has subluxation and loose joint. Choked one time and had to do the heimlich maneuver on herself. Has been eating soft foods since her jaw problem. Pain has gotten a little bit better after the dentist adjusted her denture. Clenches her teeth at night while sleeping. Feels like her jaw muscles are so slack that she has a hard time chewing.  PRECAUTIONS: No known precautions  SUBJECTIVE:  Jaw is about the same. No pain. Chewing no better. No jaw pain at worst for the past 2 weeks.        PAIN:  Are you having pain? 0/10     TODAY'S TREATMENT:       Therapeutic exercise   [employer] Latex free bands used  Pt was informed of possible speech therapy for her jaw secondary to difficulty chewing (difficulty with motor control) and no TMJ pain.    Recommended for pt to try teething tubes to target posterior molars to improve bite strength at back teeth. Pt verbalized understanding.   Standing B scapular retraction yellow band 10x5 seconds for 2 sets  Seated chin tucks 10x3 with 5 second holds  Seated thoracic extension over chair 10x5 seconds for 3 sets   Standing  B shoulder extension yellow band with B scapular retraction 10x2         Improved exercise technique, movement at target joints, use of target muscles after mod verbal, visual, tactile cues.         Response to treatment Pt tolerated session well without aggravation of symptoms.        Clinical impression  Continued working on proper cervical posture and scapular strength to promote better foundation for jaw muscles to work. Recommended pt to practice chewing on teething tubes to target back molars to improve jaw strength with chewing as tolerated. Good scapular use felt with exercises. Pt tolerated session well without aggravation of symptoms. Pt demonstrates no TMJ pain. Pt seems to have  difficulty with strength and mechanics of chewing. Pt will benefit from continued skilled physical therapy services to improve posture, strength, and ability to chew her food.      PATIENT EDUCATION: Education details: there-ex Person educated: Patient Education method: Explanation, Demonstration, Actor cues, and Verbal cues Education comprehension: verbalized understanding and returned demonstration   HOME EXERCISE PROGRAM:    Access Code: [email or web address] URL: [email or web address] Date: 04/01/2022 Prepared by: Loralyn Freshwater   Exercises - Seated Cervical Retraction  - 1 x daily - 7 x weekly - 3 sets - 10 reps - 5 seconds hold - Isometric Jaw Deviation  - 1 x daily - 7 x weekly - 3 sets - 10 reps - 5 seconds hold - Seated Scapular Retraction  - 1 x daily - 7 x weekly - 3 sets - 10 reps - 5 seconds  hold - Supine Shoulder Horizontal Abduction with Dumbbells  - 1 x daily - 7 x weekly - 3 sets - 10 reps - 5 seconds hold - Supine Cervical Retraction with Towel  - 1 x daily - 7 x weekly - 3 sets - 10 reps - 5 seconds hold -Gently open and close your jaw about 1 cm Keep jaw in neutral   Perform with chin tuck and shoulders back (Sit up straight)  - Shoulder extension with resistance - Neutral  - 1 x daily - 7 x weekly - 2 sets - 10 reps    PT Short Term Goals - 04/01/22 1822       PT SHORT TERM GOAL #1   Title Pt will be independent with her initial HEP to improve posture, strength, and improve ability to chew more comfortably.    Baseline Pt has started her initial HEP (04/01/2022)    Time 3    Period Weeks    Status New    Target Date 04/25/22              PT Long Term Goals - 04/01/22 1825       PT LONG TERM GOAL #1   Title Pt will improve her FOTO score by at least 10 points as a demonstration of improved function and ability to chew more comfortably.    Baseline orofacial FOTO 40 (04/01/2022)    Time 8    Period Weeks    Status New    Target Date  05/30/22      PT LONG TERM GOAL #2   Title Pt will improve her B scapular strength by at least 1/2 MMT grade to promote better scapular and cervical posture to promote better activation and mechanics of her jaw muscles.    Baseline Manually resisted scapular retraction targeting lower trap: 4/5 R and L (04/01/2022)    Time 8  Period Weeks    Status New    Target Date 05/30/22      PT LONG TERM GOAL #3   Title Pt will report ability to chew harder foods such as steak more comfortably to promote ability to eat a wider variety of food.    Baseline Pt currently eating soft foods secondary to her condition (04/01/2022)    Time 8    Period Weeks    Status New    Target Date 05/30/22              Plan - 05/21/22 1551     Clinical Impression Statement Continued working on proper cervical posture and scapular strength to promote better foundation for jaw muscles to work. Recommended pt to practice chewing on teething tubes to target back molars to improve jaw strength with chewing as tolerated. Good scapular use felt with exercises. Pt tolerated session well without aggravation of symptoms. Pt demonstrates no TMJ pain. Pt seems to have difficulty with strength and mechanics of chewing. Pt will benefit from continued skilled physical therapy services to improve posture, strength, and ability to chew her food.    Personal Factors and Comorbidities Comorbidity 3+;Age;Time since onset of injury/illness/exacerbation;Fitness    Comorbidities HTN, anxiety, dysrhythmia, bruises easily  ?    Examination-Activity Limitations Self Feeding    Stability/Clinical Decision Making Stable/Uncomplicated    Rehab Potential Fair    PT Frequency 2x / week    PT Duration 8 weeks    PT Treatment/Interventions Therapeutic exercise;Therapeutic activities;Neuromuscular re-education;Patient/family education;Manual techniques;Dry needling;Electrical Stimulation;Iontophoresis 4mg /ml Dexamethasone    PT Next Visit Plan  posture, cervical, scapular, jaw strengthening, jaw control, manual techniques, modalities PRN    PT Home Exercise Plan medbridge Access Code: [email or web address]    Consulted and Agree with Plan of Care Patient                   PT, DPT   05/21/2022, 4:58 PM

## 2022-05-23 ENCOUNTER — Ambulatory Visit: Payer: Medicare HMO

## 2022-05-28 ENCOUNTER — Ambulatory Visit: Payer: Medicare HMO

## 2022-05-28 DIAGNOSIS — M6281 Muscle weakness (generalized): Secondary | ICD-10-CM

## 2022-05-28 DIAGNOSIS — R293 Abnormal posture: Secondary | ICD-10-CM

## 2022-05-28 NOTE — Therapy (Signed)
OUTPATIENT PHYSICAL THERAPY Screen  and  Discharge Summary    Patient Name: Jodi Goodman MRN: [id number] DOB:[birth year], 71 y.o., female Today's Date: 05/28/2022  PCP: Jodi Marble, MD REFERRING PROVIDER: Stan Head, DMD   PT End of Session - 05/28/22 1548     Visit Number 8    Number of Visits 17    Date for PT Re-Evaluation 05/30/22    Authorization Type 8    Authorization Time Period 10    PT Start Time 1548    PT Stop Time 1555    PT Time Calculation (min) 7 min    Activity Tolerance Patient tolerated treatment well    Behavior During Therapy WFL for tasks assessed/performed                    Past Medical History:  Diagnosis Date   Anemia    H/O   Anxiety    Arthritis    Bruises easily    Dysrhythmia    PALPITATIONS   GERD (gastroesophageal reflux disease)    Hyperlipidemia    Hypertension    Past Surgical History:  Procedure Laterality Date   CESAREAN SECTION     COLONOSCOPY     EYE SURGERY Bilateral    laser to drain fluid   KNEE ARTHROSCOPY WITH MENISCAL REPAIR Left 09/26/2015   Procedure: KNEE ARTHROSCOPY WITH partial lateral menisectomy and abrasion chondoplasty of patella.;  Surgeon: Corky Mull, MD;  Location: [facility];  Service: Orthopedics;  Laterality: Left;   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   Procedure: TOTAL KNEE ARTHROPLASTY;  Surgeon: Corky Mull, MD;  Location: [facility];  Service: Orthopedics;  Laterality: Left;   WISDOM TOOTH EXTRACTION     Patient Active Problem List   Diagnosis Date Noted   Acute colitis 09/09/2019   Hypokalemia 09/09/2019   Dehydration 09/09/2019   Status post total knee replacement using cement, left 08/10/2019   Chronic pain of left knee 02/11/2019   Chronic pain syndrome 02/11/2019   Primary osteoarthritis of left knee 05/08/2015    REFERRING DIAG: TMJ click  THERAPY DIAG:  Muscle weakness (generalized)  Abnormal posture  Rationale for Evaluation and Treatment  Rehabilitation  PERTINENT HISTORY: TMJ. Pain began gradually about 1 year ago. R temporomandibular joint bothers her. Was told that she has subluxation and loose joint. Choked one time and had to do the heimlich maneuver on herself. Has been eating soft foods since her jaw problem. Pain has gotten a little bit better after the dentist adjusted her denture. Clenches her teeth at night while sleeping. Feels like her jaw muscles are so slack that she has a hard time chewing.  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 Chewing is the same. Could not find the chew/teething tubes. Tried chewing gum, did not work out. Physical therapy is helping with the posture.       PAIN:  Are you having pain? 0/10       Clinical impression Pt demonstrates improved B scapular strength, function, and overall posture (when cued) since initial evaluation. Pt however demonstrates no improvement in chewing. No complain of pain at all. Skilled physical therapy services discharged secondary to no progress with pt personal goal of increasing ability to chew.         PATIENT EDUCATION: Education details: there-ex Person educated: Patient Education method: Explanation, Demonstration, Tactile cues, and Verbal cues Education comprehension: verbalized understanding and returned demonstration  HOME EXERCISE PROGRAM:    Access Code: [email or web address] URL: [email or web address] Date: 04/01/2022 Prepared by: Joneen Boers   Exercises - Seated Cervical Retraction  - 1 x daily - 7 x weekly - 3 sets - 10 reps - 5 seconds hold - Isometric Jaw Deviation  - 1 x daily - 7 x weekly - 3 sets - 10 reps - 5 seconds hold - Seated Scapular Retraction  - 1 x daily - 7 x weekly - 3 sets - 10 reps - 5 seconds  hold - Supine Shoulder Horizontal Abduction with Dumbbells  - 1 x daily - 7 x weekly - 3 sets - 10 reps - 5 seconds hold - Supine Cervical Retraction with  Towel  - 1 x daily - 7 x weekly - 3 sets - 10 reps - 5 seconds hold -Gently open and close your jaw about 1 cm Keep jaw in neutral   Perform with chin tuck and shoulders back (Sit up straight)  - Shoulder extension with resistance - Neutral  - 1 x daily - 7 x weekly - 2 sets - 10 reps    PT Short Term Goals - 04/01/22 1822       PT SHORT TERM GOAL #1   Title Pt will be independent with her initial HEP to improve posture, strength, and improve ability to chew more comfortably.    Baseline Pt has started her initial HEP (04/01/2022)    Time 3    Period Weeks    Status New    Target Date 04/25/22              PT Long Term Goals - 05/28/22 1554       PT LONG TERM GOAL #1   Title Pt will improve her FOTO score by at least 10 points as a demonstration of improved function and ability to chew more comfortably.    Baseline orofacial FOTO 40 (04/01/2022); 48 (05/28/2022)    Time 8    Period Weeks    Status Partially Met    Target Date 05/30/22      PT LONG TERM GOAL #2   Title Pt will improve her B scapular strength by at least 1/2 MMT grade to promote better scapular and cervical posture to promote better activation and mechanics of her jaw muscles.    Baseline Manually resisted scapular retraction targeting lower trap: 4/5 R and L (04/01/2022); 4+/5 R and L (05/28/2022)    Time 8    Period Weeks    Status Achieved    Target Date 05/30/22      PT LONG TERM GOAL #3   Title Pt will report ability to chew harder foods such as steak more comfortably to promote ability to eat a wider variety of food.    Baseline Pt currently eating soft foods secondary to her condition (04/01/2022); (05/28/2022)    Time 8    Period Weeks    Status Not Met    Target Date 05/30/22              Plan - 05/28/22 1547     Clinical Impression Statement Pt demonstrates improved B scapular strength, function, and overall posture (when cued) since initial evaluation. Pt however demonstrates no  improvement in chewing. No complain of pain at all. Skilled physical therapy services discharged secondary to no progress with pt personal goal of increasing ability to chew.    Personal Factors and Comorbidities Comorbidity 3+;Age;Time since onset of injury/illness/exacerbation;Fitness    Comorbidities  HTN, anxiety, dysrhythmia, bruises easily  ?    Examination-Activity Limitations Self Feeding    Stability/Clinical Decision Making --    Rehab Potential --    PT Frequency --    PT Duration --    PT Treatment/Interventions Therapeutic exercise;Therapeutic activities;Neuromuscular re-education;Patient/family education;Manual techniques    PT Next Visit Plan Continue with HEP    PT Home Exercise Plan medbridge Access Code: [email or web address]    Consulted and Agree with Plan of Care Patient                  Thank you for your referral.   Joneen Boers PT, DPT   05/28/2022, 4:09 PM

## 2022-06-04 ENCOUNTER — Ambulatory Visit: Payer: Medicare HMO

## 2022-06-13 DIAGNOSIS — I10 Essential (primary) hypertension: Secondary | ICD-10-CM | POA: Diagnosis not present

## 2022-06-13 DIAGNOSIS — E876 Hypokalemia: Secondary | ICD-10-CM | POA: Diagnosis not present

## 2022-06-13 DIAGNOSIS — R7301 Impaired fasting glucose: Secondary | ICD-10-CM | POA: Diagnosis not present

## 2022-06-13 DIAGNOSIS — E559 Vitamin D deficiency, unspecified: Secondary | ICD-10-CM | POA: Diagnosis not present

## 2022-06-13 DIAGNOSIS — Z0001 Encounter for general adult medical examination with abnormal findings: Secondary | ICD-10-CM | POA: Diagnosis not present

## 2022-06-13 DIAGNOSIS — E785 Hyperlipidemia, unspecified: Secondary | ICD-10-CM | POA: Diagnosis not present

## 2022-06-13 DIAGNOSIS — E669 Obesity, unspecified: Secondary | ICD-10-CM | POA: Diagnosis not present

## 2022-06-17 DIAGNOSIS — G5600 Carpal tunnel syndrome, unspecified upper limb: Secondary | ICD-10-CM | POA: Diagnosis not present

## 2022-06-17 DIAGNOSIS — R7303 Prediabetes: Secondary | ICD-10-CM | POA: Diagnosis not present

## 2022-06-17 DIAGNOSIS — R609 Edema, unspecified: Secondary | ICD-10-CM | POA: Diagnosis not present

## 2022-06-17 DIAGNOSIS — K219 Gastro-esophageal reflux disease without esophagitis: Secondary | ICD-10-CM | POA: Diagnosis not present

## 2022-06-17 DIAGNOSIS — R69 Illness, unspecified: Secondary | ICD-10-CM | POA: Diagnosis not present

## 2022-06-17 DIAGNOSIS — N951 Menopausal and female climacteric states: Secondary | ICD-10-CM | POA: Diagnosis not present

## 2022-06-17 DIAGNOSIS — I10 Essential (primary) hypertension: Secondary | ICD-10-CM | POA: Diagnosis not present

## 2022-06-17 DIAGNOSIS — E785 Hyperlipidemia, unspecified: Secondary | ICD-10-CM | POA: Diagnosis not present

## 2022-06-17 DIAGNOSIS — E559 Vitamin D deficiency, unspecified: Secondary | ICD-10-CM | POA: Diagnosis not present

## 2022-06-17 DIAGNOSIS — M13862 Other specified arthritis, left knee: Secondary | ICD-10-CM | POA: Diagnosis not present

## 2022-09-13 DIAGNOSIS — E559 Vitamin D deficiency, unspecified: Secondary | ICD-10-CM | POA: Diagnosis not present

## 2022-09-13 DIAGNOSIS — R7303 Prediabetes: Secondary | ICD-10-CM | POA: Diagnosis not present

## 2022-09-13 DIAGNOSIS — E785 Hyperlipidemia, unspecified: Secondary | ICD-10-CM | POA: Diagnosis not present

## 2022-09-17 DIAGNOSIS — F411 Generalized anxiety disorder: Secondary | ICD-10-CM | POA: Diagnosis not present

## 2022-09-17 DIAGNOSIS — N951 Menopausal and female climacteric states: Secondary | ICD-10-CM | POA: Diagnosis not present

## 2022-09-17 DIAGNOSIS — I10 Essential (primary) hypertension: Secondary | ICD-10-CM | POA: Diagnosis not present

## 2022-09-17 DIAGNOSIS — K219 Gastro-esophageal reflux disease without esophagitis: Secondary | ICD-10-CM | POA: Diagnosis not present

## 2022-09-17 DIAGNOSIS — E785 Hyperlipidemia, unspecified: Secondary | ICD-10-CM | POA: Diagnosis not present

## 2022-09-17 DIAGNOSIS — R609 Edema, unspecified: Secondary | ICD-10-CM | POA: Diagnosis not present

## 2022-09-17 DIAGNOSIS — G5600 Carpal tunnel syndrome, unspecified upper limb: Secondary | ICD-10-CM | POA: Diagnosis not present

## 2022-09-17 DIAGNOSIS — R69 Illness, unspecified: Secondary | ICD-10-CM | POA: Diagnosis not present

## 2022-09-17 DIAGNOSIS — E559 Vitamin D deficiency, unspecified: Secondary | ICD-10-CM | POA: Diagnosis not present

## 2022-09-17 DIAGNOSIS — M13862 Other specified arthritis, left knee: Secondary | ICD-10-CM | POA: Diagnosis not present

## 2022-09-17 DIAGNOSIS — R7303 Prediabetes: Secondary | ICD-10-CM | POA: Diagnosis not present

## 2022-10-14 IMAGING — MG MM DIGITAL SCREENING BILAT W/ TOMO AND CAD
6 of 10 series · 6 of 30 positions shown · non-contrast
Comparison: Previous exam(s).

CLINICAL DATA: Screening.

EXAM:
DIGITAL SCREENING BILATERAL MAMMOGRAM WITH TOMOSYNTHESIS AND CAD
TECHNIQUE: Bilateral screening digital craniocaudal and mediolateral oblique
mammograms were obtained. Bilateral screening digital breast
tomosynthesis was performed. The images were evaluated with
computer-aided detection.

[R MLO synth-2D]
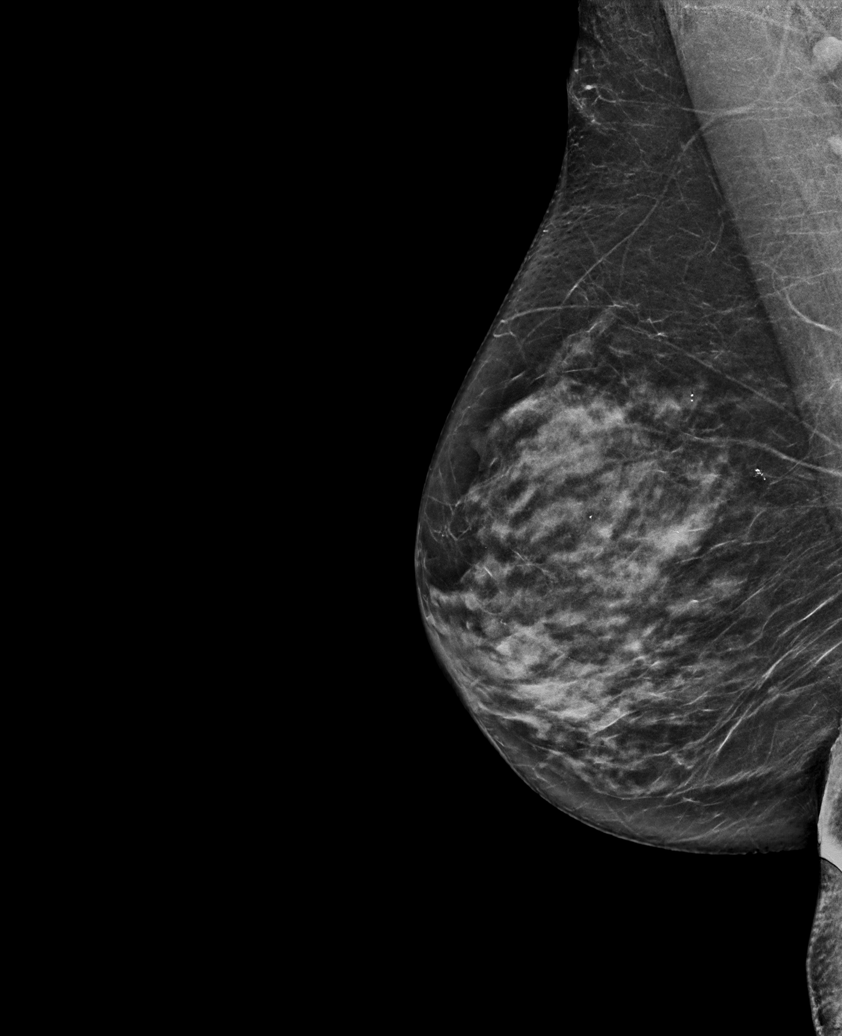

[L CC synth-2D]
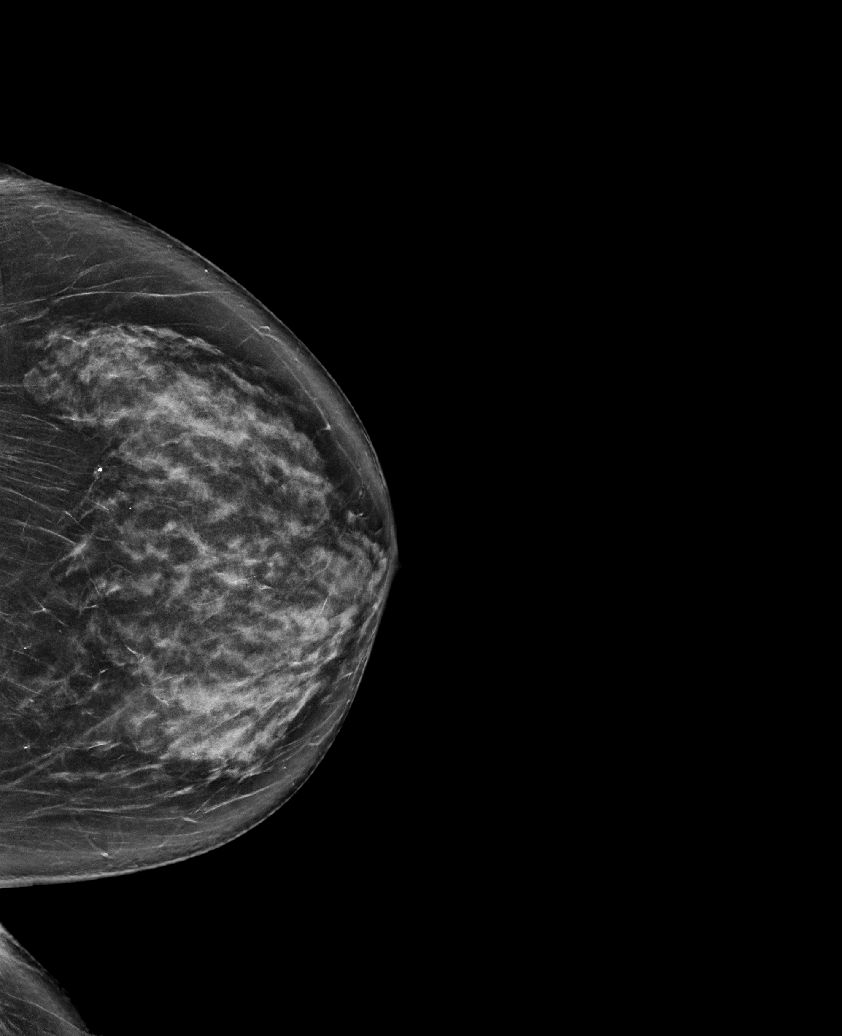

[R CC synth-2D (1 of 2)]
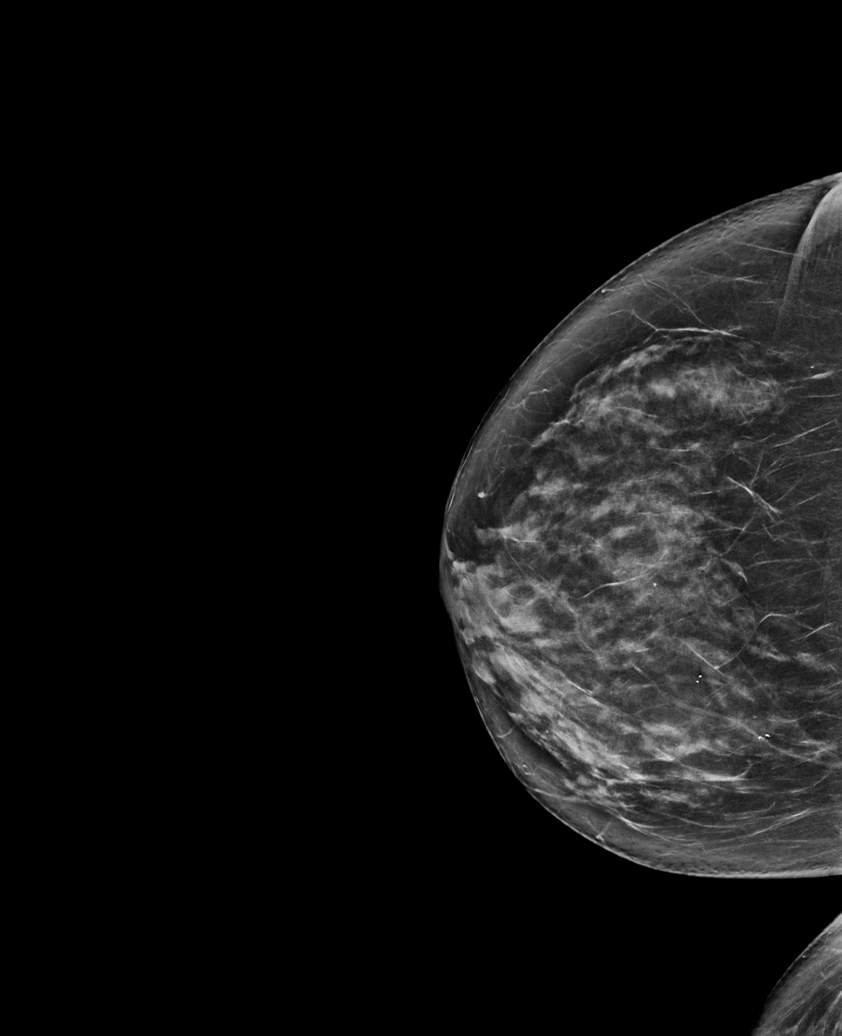

[R CC synth-2D (2 of 2)]
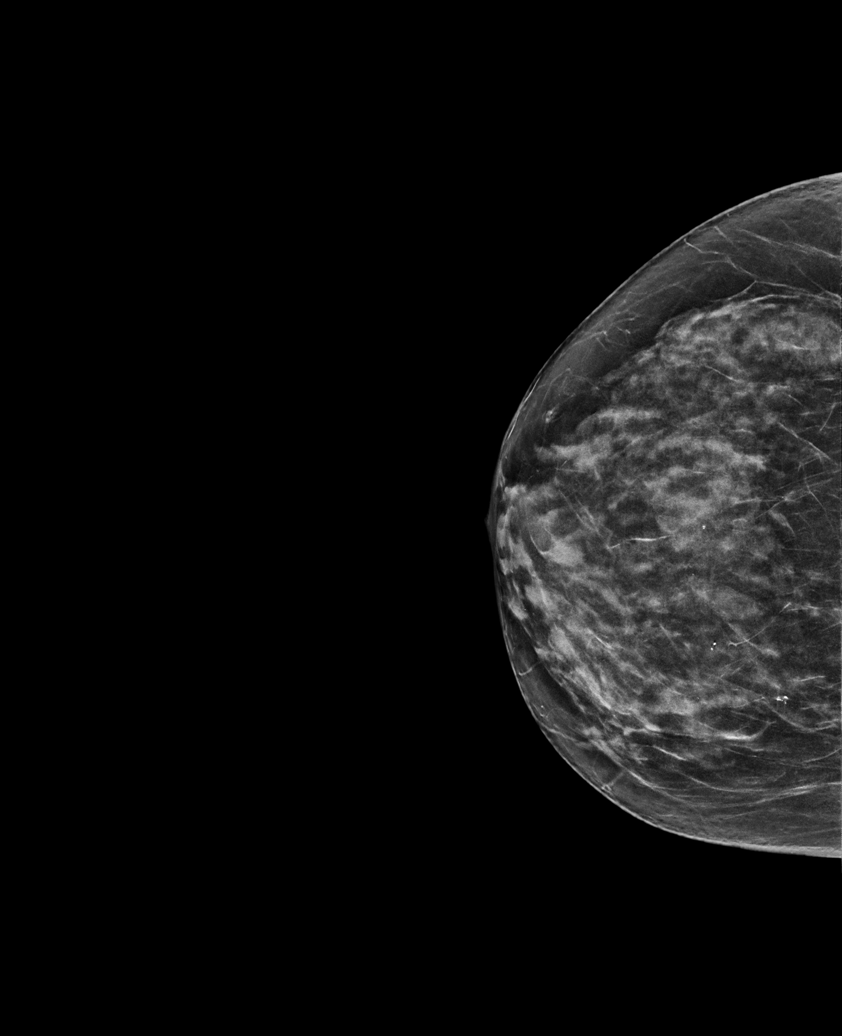

[L MLO synth-2D]
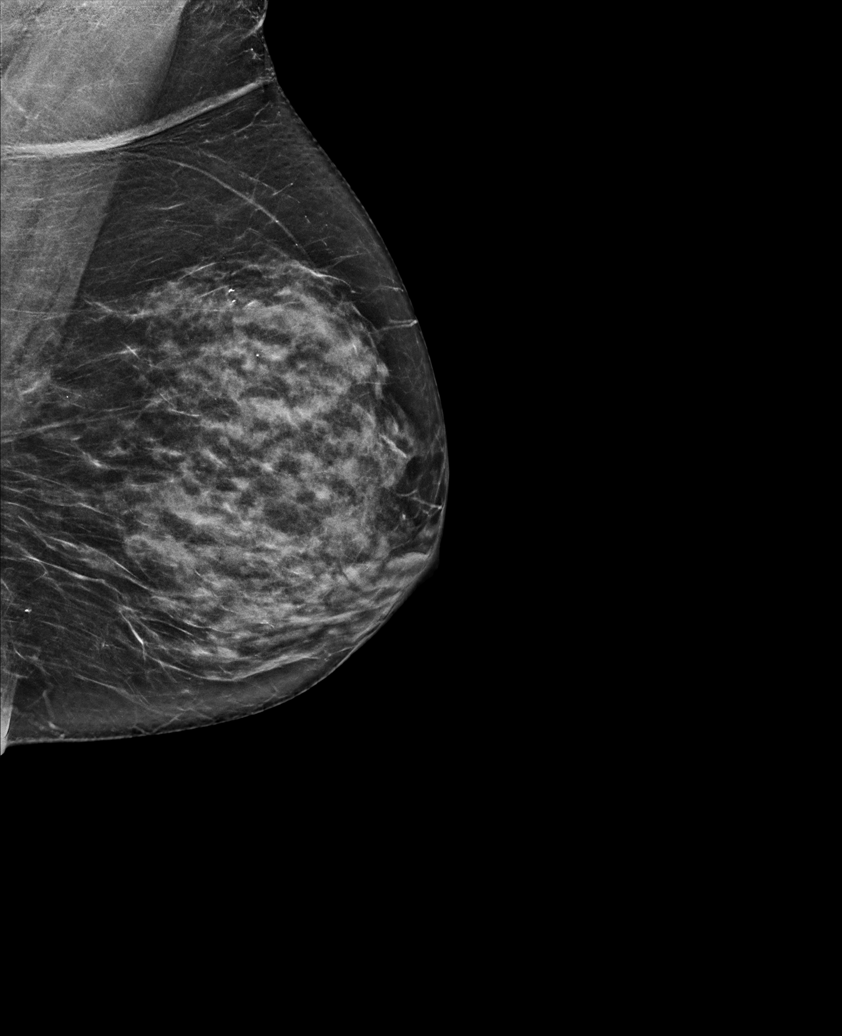

[R CC tomo · tomo slice 35/70.0]
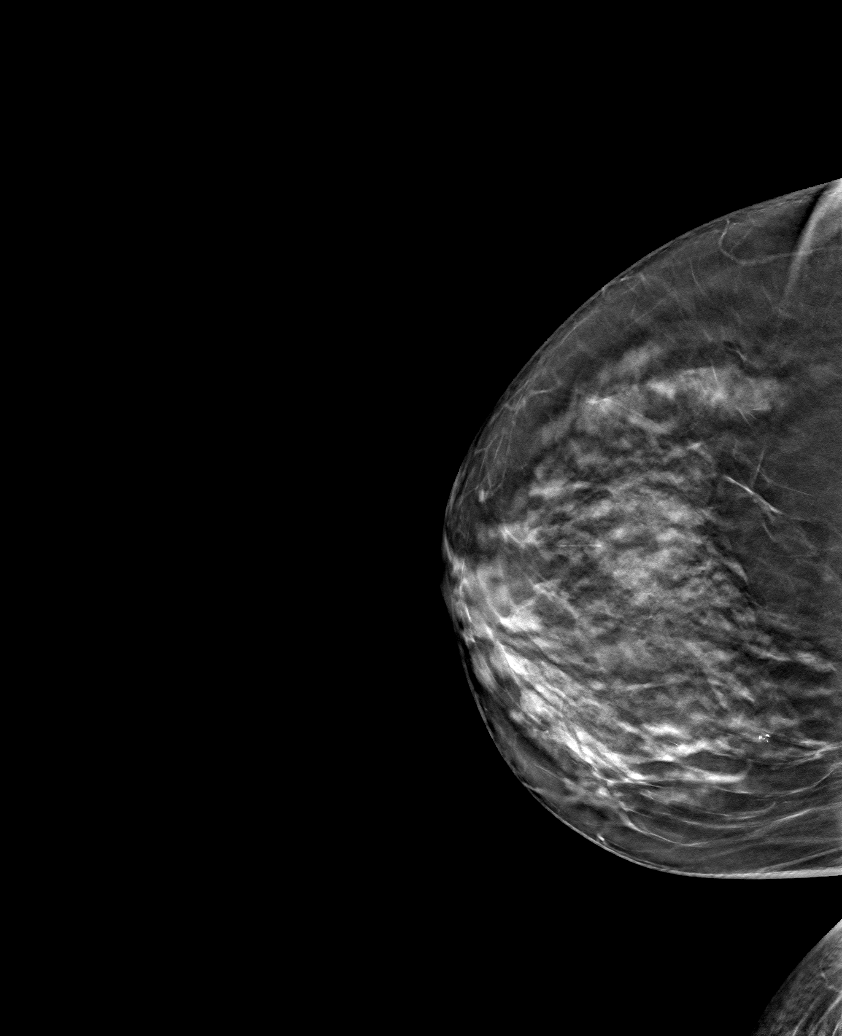

[6 of 30 positions shown; findings below may reference images not displayed]

ACR Breast Density Category c: The breast tissue is heterogeneously
dense, which may obscure small masses.
FINDINGS: There are no findings suspicious for malignancy. The images were
evaluated with computer-aided detection.
IMPRESSION: No mammographic evidence of malignancy. A result letter of this
screening mammogram will be mailed directly to the patient.

RECOMMENDATION:
Screening mammogram in one year. (Code:T4-5-GWO)

BI-RADS CATEGORY  1: Negative.

## 2022-10-15 DIAGNOSIS — R059 Cough, unspecified: Secondary | ICD-10-CM | POA: Diagnosis not present

## 2022-10-15 DIAGNOSIS — Z20822 Contact with and (suspected) exposure to covid-19: Secondary | ICD-10-CM | POA: Diagnosis not present

## 2022-10-15 DIAGNOSIS — H6691 Otitis media, unspecified, right ear: Secondary | ICD-10-CM | POA: Diagnosis not present

## 2022-10-15 DIAGNOSIS — J069 Acute upper respiratory infection, unspecified: Secondary | ICD-10-CM | POA: Diagnosis not present

## 2022-12-20 ENCOUNTER — Encounter: Payer: Self-pay | Admitting: Internal Medicine

## 2022-12-20 ENCOUNTER — Ambulatory Visit (INDEPENDENT_AMBULATORY_CARE_PROVIDER_SITE_OTHER): Payer: Medicare HMO | Admitting: Internal Medicine

## 2022-12-20 VITALS — BP 140/80 | HR 77 | Ht 64.0 in | Wt 143.6 lb

## 2022-12-20 DIAGNOSIS — M5412 Radiculopathy, cervical region: Secondary | ICD-10-CM | POA: Diagnosis not present

## 2022-12-20 DIAGNOSIS — R69 Illness, unspecified: Secondary | ICD-10-CM | POA: Diagnosis not present

## 2022-12-20 DIAGNOSIS — F411 Generalized anxiety disorder: Secondary | ICD-10-CM

## 2022-12-20 DIAGNOSIS — E7800 Pure hypercholesterolemia, unspecified: Secondary | ICD-10-CM

## 2022-12-20 DIAGNOSIS — I10 Essential (primary) hypertension: Secondary | ICD-10-CM | POA: Diagnosis not present

## 2022-12-20 MED ORDER — EZETIMIBE 10 MG PO TABS: 10.0000 mg | ORAL_TABLET | Freq: Every day | ORAL | 0 refills | Status: DC

## 2022-12-20 MED ORDER — GABAPENTIN 100 MG PO CAPS: 100.0000 mg | ORAL_CAPSULE | Freq: Three times a day (TID) | ORAL | 0 refills | Status: DC

## 2022-12-20 MED ORDER — ALPRAZOLAM 0.25 MG PO TABS: 0.2500 mg | ORAL_TABLET | Freq: Two times a day (BID) | ORAL | 2 refills | Status: DC | PRN

## 2022-12-20 NOTE — Progress Notes (Signed)
Established Patient Office Visit  Subjective:  Patient ID: Jodi Goodman, female    DOB: 03-05-1951  Age: 72 y.o. MRN: [id number]  Chief Complaint  Patient presents with   Follow-up    3 month follow up    No new complaints, here for med refills. C/o feeling stressed due to Lindy working on [employer] in front of her house. C/o numbness in her left thumb and index finger exacerbated by lying on that side.      Past Medical History:  Diagnosis Date   Anemia    H/O   Anxiety    Arthritis    Bruises easily    Dysrhythmia    PALPITATIONS   GERD (gastroesophageal reflux disease)    Hyperlipidemia    Hypertension     Social History   Socioeconomic History   Marital status: Single    Spouse name: Not on file   Number of children: Not on file   Years of education: Not on file   Highest education level: Not on file  Occupational History   Not on file  Tobacco Use   Smoking status: Never   Smokeless tobacco: Never  Vaping Use   Vaping Use: Never used  Substance and Sexual Activity   Alcohol use: No   Drug use: No   Sexual activity: Not on file  Other Topics Concern   Not on file  Social History Narrative   Not on file   Social Determinants of Health   Financial Resource Strain: Not on file  Food Insecurity: Not on file  Transportation Needs: Not on file  Physical Activity: Not on file  Stress: Not on file  Social Connections: Not on file  Intimate Partner Violence: Not on file    Family History  Problem Relation Age of Onset   Breast cancer Neg Hx     Allergies  Allergen Reactions   Latex Hives   Penicillins Hives    Did it involve swelling of the face/tongue/throat, SOB, or low BP? Unknown Did it involve sudden or severe rash/hives, skin peeling, or any reaction on the inside of your mouth or nose? Yes Did you need to seek medical attention at a hospital or doctor'Albaraa Swingle office? No When did it last happen? Over 40 years ago      If all above  answers are "NO", may proceed with cephalosporin use.   Tramadol Other (See Comments)    Dry mouth   Aspirin Nausea Only    Patient can tolerate coated aspirin   Sulfa Antibiotics Hives and Other (See Comments)    DRY MOUTH    Review of Systems  Constitutional: Negative.   HENT: Negative.    Eyes: Negative.   Respiratory: Negative.    Cardiovascular: Negative.   Gastrointestinal: Negative.   Genitourinary: Negative.   Skin: Negative.   Neurological: Negative.   Endo/Heme/Allergies: Negative.        Objective:   BP (!) 140/80   Pulse 77   Ht '5\' 4"'$  (1.626 m)   Wt 143 lb 9.6 oz (65.1 kg)   SpO2 100%   BMI 24.65 kg/m   Vitals:   12/20/22 1405  BP: (!) 140/80  Pulse: 77  Height: '5\' 4"'$  (1.626 m)  Weight: 143 lb 9.6 oz (65.1 kg)  SpO2: 100%  BMI (Calculated): 24.64    Physical Exam Vitals reviewed.  Constitutional:      General: She is not in acute distress. HENT:     Head: Normocephalic.  Nose: Nose normal.     Mouth/Throat:     Mouth: Mucous membranes are moist.  Eyes:     Extraocular Movements: Extraocular movements intact.     Pupils: Pupils are equal, round, and reactive to light.  Cardiovascular:     Rate and Rhythm: Normal rate and regular rhythm.     Heart sounds: No murmur heard. Pulmonary:     Effort: Pulmonary effort is normal.     Breath sounds: No rhonchi or rales.  Abdominal:     General: Abdomen is flat.     Palpations: There is no hepatomegaly, splenomegaly or mass.  Musculoskeletal:        General: Normal range of motion.     Cervical back: Normal range of motion. No tenderness.  Skin:    General: Skin is warm and dry.  Neurological:     General: No focal deficit present.     Mental Status: She is alert and oriented to person, place, and time.     Cranial Nerves: No cranial nerve deficit.     Motor: No weakness.     Comments: No sensory impairment  Psychiatric:        Mood and Affect: Mood normal.        Behavior: Behavior  normal.      No results found for any visits on 12/20/22.  No results found for this or any previous visit (from the past 2160 hour(Alaine Loughney)).    Assessment & Plan:   Problem List Items Addressed This Visit   None 1. Cervical radiculopathy at C5  2. GAD (generalized anxiety disorder)  3. Pure hypercholesterolemia  4. Primary hypertension    No follow-ups on file.   Total time spent: 20 minutes  Volanda Napoleon, MD  12/20/2022

## 2023-01-06 ENCOUNTER — Ambulatory Visit (INDEPENDENT_AMBULATORY_CARE_PROVIDER_SITE_OTHER): Payer: Medicare HMO

## 2023-01-06 DIAGNOSIS — M5412 Radiculopathy, cervical region: Secondary | ICD-10-CM

## 2023-03-11 ENCOUNTER — Other Ambulatory Visit: Payer: Self-pay | Admitting: Internal Medicine

## 2023-03-11 DIAGNOSIS — Z1231 Encounter for screening mammogram for malignant neoplasm of breast: Secondary | ICD-10-CM

## 2023-03-20 ENCOUNTER — Other Ambulatory Visit: Payer: Medicare HMO

## 2023-03-20 DIAGNOSIS — E7800 Pure hypercholesterolemia, unspecified: Secondary | ICD-10-CM

## 2023-03-20 DIAGNOSIS — I10 Essential (primary) hypertension: Secondary | ICD-10-CM | POA: Diagnosis not present

## 2023-03-20 DIAGNOSIS — R7301 Impaired fasting glucose: Secondary | ICD-10-CM

## 2023-03-21 LAB — CMP14+EGFR
ALT: 16 IU/L (ref 0–32)
AST: 19 IU/L (ref 0–40)
Albumin/Globulin Ratio: 1.9 (ref 1.2–2.2)
Albumin: 4.2 g/dL (ref 3.8–4.8)
Alkaline Phosphatase: 144 IU/L — ABNORMAL HIGH (ref 44–121)
BUN/Creatinine Ratio: 23 (ref 12–28)
BUN: 16 mg/dL (ref 8–27)
Bilirubin Total: 0.2 mg/dL (ref 0.0–1.2)
CO2: 20 mmol/L (ref 20–29)
Calcium: 9.9 mg/dL (ref 8.7–10.3)
Chloride: 105 mmol/L (ref 96–106)
Creatinine, Ser: 0.7 mg/dL (ref 0.57–1.00)
Globulin, Total: 2.2 g/dL (ref 1.5–4.5)
Glucose: 91 mg/dL (ref 70–99)
Potassium: 3.7 mmol/L (ref 3.5–5.2)
Sodium: 141 mmol/L (ref 134–144)
Total Protein: 6.4 g/dL (ref 6.0–8.5)
eGFR: 92 mL/min/{1.73_m2} (ref >59)

## 2023-03-21 LAB — CBC WITH DIFFERENTIAL
Basophils Absolute: 0.1 10*3/uL (ref 0.0–0.2)
Basos: 1 %
EOS (ABSOLUTE): 0.1 10*3/uL (ref 0.0–0.4)
Eos: 2 %
Hematocrit: 39 % (ref 34.0–46.6)
Hemoglobin: 13 g/dL (ref 11.1–15.9)
Immature Grans (Abs): 0 10*3/uL (ref 0.0–0.1)
Immature Granulocytes: 0 %
Lymphocytes Absolute: 1.7 10*3/uL (ref 0.7–3.1)
Lymphs: 27 %
MCH: 31.1 pg (ref 26.6–33.0)
MCHC: 33.3 g/dL (ref 31.5–35.7)
MCV: 93 fL (ref 79–97)
Monocytes Absolute: 0.6 10*3/uL (ref 0.1–0.9)
Monocytes: 10 %
Neutrophils Absolute: 3.7 10*3/uL (ref 1.4–7.0)
Neutrophils: 60 %
RBC: 4.18 x10E6/uL (ref 3.77–5.28)
RDW: 12.8 % (ref 11.7–15.4)
WBC: 6.2 10*3/uL (ref 3.4–10.8)

## 2023-03-21 LAB — LIPID PANEL
Chol/HDL Ratio: 3.1 ratio (ref 0.0–4.4)
Cholesterol, Total: 177 mg/dL (ref 100–199)
HDL: 58 mg/dL (ref >39)
LDL Chol Calc (NIH): 102 mg/dL — ABNORMAL HIGH (ref 0–99)
Triglycerides: 92 mg/dL (ref 0–149)
VLDL Cholesterol Cal: 17 mg/dL (ref 5–40)

## 2023-03-21 LAB — HEMOGLOBIN A1C
Est. average glucose Bld gHb Est-mCnc: 120 mg/dL
Hgb A1c MFr Bld: 5.8 % — ABNORMAL HIGH (ref 4.8–5.6)

## 2023-03-21 LAB — TSH: TSH: 1.9 u[IU]/mL (ref 0.450–4.500)

## 2023-03-24 ENCOUNTER — Encounter: Payer: Self-pay | Admitting: Internal Medicine

## 2023-03-24 ENCOUNTER — Ambulatory Visit (INDEPENDENT_AMBULATORY_CARE_PROVIDER_SITE_OTHER): Payer: Medicare HMO | Admitting: Internal Medicine

## 2023-03-24 VITALS — BP 132/80 | HR 60 | Ht 64.0 in | Wt 146.0 lb

## 2023-03-24 DIAGNOSIS — R7303 Prediabetes: Secondary | ICD-10-CM

## 2023-03-24 DIAGNOSIS — E7800 Pure hypercholesterolemia, unspecified: Secondary | ICD-10-CM

## 2023-03-24 DIAGNOSIS — I10 Essential (primary) hypertension: Secondary | ICD-10-CM | POA: Diagnosis not present

## 2023-03-24 DIAGNOSIS — F411 Generalized anxiety disorder: Secondary | ICD-10-CM | POA: Diagnosis not present

## 2023-03-24 DIAGNOSIS — M5412 Radiculopathy, cervical region: Secondary | ICD-10-CM

## 2023-03-24 MED ORDER — RAMIPRIL 10 MG PO CAPS
10.0000 mg | ORAL_CAPSULE | Freq: Every day | ORAL | 0 refills | Status: DC
Start: 2023-03-24 — End: 2023-06-21

## 2023-03-24 MED ORDER — CHLORTHALIDONE 25 MG PO TABS: 12.5000 mg | ORAL_TABLET | Freq: Every day | ORAL | 1 refills | Status: DC

## 2023-03-24 MED ORDER — ALPRAZOLAM 0.25 MG PO TABS
0.2500 mg | ORAL_TABLET | Freq: Two times a day (BID) | ORAL | 0 refills | Status: DC | PRN
Start: 2023-03-24 — End: 2023-12-29

## 2023-03-24 MED ORDER — EZETIMIBE 10 MG PO TABS: 10.0000 mg | ORAL_TABLET | Freq: Every day | ORAL | Status: DC

## 2023-03-24 MED ORDER — GABAPENTIN 100 MG PO CAPS: 100.0000 mg | ORAL_CAPSULE | Freq: Three times a day (TID) | ORAL | 0 refills | Status: DC

## 2023-03-24 NOTE — Progress Notes (Signed)
Established Patient Office Visit  Subjective:  Patient ID: Jodi Goodman, female    DOB: [birth year]  Age: 72 y.o. MRN: [id number]  Chief Complaint  Patient presents with   Follow-up    3 Months Follow Up    No new complaints, here for lab review and medication refills. Labs reviewed and notable for A1c in the prediabetic range, LDL slightly elevated with unremarkable CMP and CBC.    No other concerns at this time.   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   Procedure: TOTAL KNEE ARTHROPLASTY;  Surgeon: Christena Flake, MD;  Location: [facility];  Service: Orthopedics;  Laterality: Left;   WISDOM TOOTH EXTRACTION      Social History   Socioeconomic History   Marital status: Single    Spouse name: Not on file   Number of children: Not on file   Years of education: Not on file   Highest education level: Not on file  Occupational History   Occupation: Janitorial services  Tobacco Use   Smoking status: Never   Smokeless tobacco: Never  Vaping Use   Vaping Use: Never used  Substance and Sexual Activity   Alcohol use: No   Drug use: No   Sexual activity: Not Currently  Other Topics Concern   Not on file  Social History Narrative   Not on file   Social Determinants of Health   Financial Resource Strain: Not on file  Food Insecurity: Not on file  Transportation Needs: Not  on file  Physical Activity: Not on file  Stress: Not on file  Social Connections: Not on file  Intimate Partner Violence: Not on file    Family History  Problem Relation Age of Onset   Stroke Brother    Heart disease Brother    Breast cancer Neg Hx     Allergies  Allergen Reactions   Latex Hives   Penicillins Hives    Did it involve swelling of the face/tongue/throat, SOB, or low BP? Unknown Did it involve sudden or severe rash/hives, skin peeling, or any reaction on the inside of your mouth or nose? Yes Did you need to seek medical attention at a hospital or doctor'Porcha Deblanc office? No When did it last happen? Over 40 years ago      If all above answers are "NO", may proceed with cephalosporin use.   Tramadol Other (See Comments)    Dry mouth   Aspirin Nausea Only    Patient can tolerate coated aspirin   Sulfa Antibiotics Hives and Other (See Comments)    DRY MOUTH    Review of Systems  Constitutional: Negative.   HENT: Negative.    Eyes: Negative.   Respiratory: Negative.    Cardiovascular: Negative.   Gastrointestinal: Negative.   Genitourinary: Negative.   Skin:  Negative.   Neurological: Negative.   Endo/Heme/Allergies: Negative.        Objective:   BP 132/80   Pulse 60   Ht 5\' 4"  (1.626 m)   Wt 146 lb (66.2 kg)   SpO2 99%   BMI 25.06 kg/m   Vitals:   03/24/23 1402  BP: 132/80  Pulse: 60  Height: 5\' 4"  (1.626 m)  Weight: 146 lb (66.2 kg)  SpO2: 99%  BMI (Calculated): 25.05    Physical Exam Vitals reviewed.  Constitutional:      General: She is not in acute distress. HENT:     Head: Normocephalic.     Nose: Nose normal.     Mouth/Throat:     Mouth: Mucous membranes are moist.  Eyes:     Extraocular Movements: Extraocular movements intact.     Pupils: Pupils are equal, round, and reactive to light.  Cardiovascular:     Rate and Rhythm: Normal rate and regular rhythm.     Heart sounds: No murmur heard. Pulmonary:     Effort: Pulmonary effort  is normal.     Breath sounds: No rhonchi or rales.  Abdominal:     General: Abdomen is flat.     Palpations: There is no hepatomegaly, splenomegaly or mass.  Musculoskeletal:        General: Normal range of motion.     Cervical back: Normal range of motion. No tenderness.  Skin:    General: Skin is warm and dry.  Neurological:     General: No focal deficit present.     Mental Status: She is alert and oriented to person, place, and time.     Cranial Nerves: No cranial nerve deficit.     Motor: No weakness.     Comments: No sensory impairment  Psychiatric:        Mood and Affect: Mood normal.        Behavior: Behavior normal.      No results found for any visits on 03/24/23.  Recent Results (from the past 2160 hour(Kimberly Nieland))  Hemoglobin A1c     Status: Abnormal   Collection Time: 03/20/23  8:56 AM  Result Value Ref Range   Hgb A1c MFr Bld 5.8 (H) 4.8 - 5.6 %    Comment:          Prediabetes: 5.7 - 6.4          Diabetes: >6.4          Glycemic control for adults with diabetes: <7.0    Est. average glucose Bld gHb Est-mCnc 120 mg/dL  TSH     Status: None   Collection Time: 03/20/23  8:56 AM  Result Value Ref Range   TSH 1.900 0.450 - 4.500 uIU/mL  CMP14+EGFR     Status: Abnormal   Collection Time: 03/20/23  8:56 AM  Result Value Ref Range   Glucose 91 70 - 99 mg/dL   BUN 16 8 - 27 mg/dL   Creatinine, Ser 1.61 0.57 - 1.00 mg/dL   eGFR 92 >09 UE/AVW/0.98   BUN/Creatinine Ratio 23 12 - 28   Sodium 141 134 - 144 mmol/L   Potassium 3.7 3.5 - 5.2 mmol/L   Chloride 105 96 - 106 mmol/L   CO2 20 20 - 29 mmol/L   Calcium 9.9 8.7 - 10.3 mg/dL   Total Protein 6.4 6.0 - 8.5 g/dL   Albumin 4.2 3.8 - 4.8 g/dL   Globulin, Total 2.2 1.5 - 4.5 g/dL   Albumin/Globulin  Ratio 1.9 1.2 - 2.2   Bilirubin Total 0.2 0.0 - 1.2 mg/dL   Alkaline Phosphatase 144 (H) 44 - 121 IU/L   AST 19 0 - 40 IU/L   ALT 16 0 - 32 IU/L  Lipid panel     Status: Abnormal   Collection Time: 03/20/23  8:56 AM   Result Value Ref Range   Cholesterol, Total 177 100 - 199 mg/dL   Triglycerides 92 0 - 149 mg/dL   HDL 58 >91 mg/dL   VLDL Cholesterol Cal 17 5 - 40 mg/dL   LDL Chol Calc (NIH) 478 (H) 0 - 99 mg/dL   Chol/HDL Ratio 3.1 0.0 - 4.4 ratio    Comment:                                   T. Chol/HDL Ratio                                             Men  Women                               1/2 Avg.Risk  3.4    3.3                                   Avg.Risk  5.0    4.4                                2X Avg.Risk  9.6    7.1                                3X Avg.Risk 23.4   11.0   CBC With Differential     Status: None   Collection Time: 03/20/23  8:56 AM  Result Value Ref Range   WBC 6.2 3.4 - 10.8 x10E3/uL   RBC 4.18 3.77 - 5.28 x10E6/uL   Hemoglobin 13.0 11.1 - 15.9 g/dL   Hematocrit 29.5 62.1 - 46.6 %   MCV 93 79 - 97 fL   MCH 31.1 26.6 - 33.0 pg   MCHC 33.3 31.5 - 35.7 g/dL   RDW 30.8 65.7 - 84.6 %   Neutrophils 60 Not Estab. %   Lymphs 27 Not Estab. %   Monocytes 10 Not Estab. %   Eos 2 Not Estab. %   Basos 1 Not Estab. %   Neutrophils Absolute 3.7 1.4 - 7.0 x10E3/uL   Lymphocytes Absolute 1.7 0.7 - 3.1 x10E3/uL   Monocytes Absolute 0.6 0.1 - 0.9 x10E3/uL   EOS (ABSOLUTE) 0.1 0.0 - 0.4 x10E3/uL   Basophils Absolute 0.1 0.0 - 0.2 x10E3/uL   Immature Granulocytes 0 Not Estab. %   Immature Grans (Abs) 0.0 0.0 - 0.1 x10E3/uL      Assessment & Plan:   As per problem list.  Problem List Items Addressed This Visit   None Visit Diagnoses     GAD (generalized anxiety disorder)    -  Primary   Relevant Medications   ALPRAZolam (XANAX) 0.25 MG tablet   Pure  hypercholesterolemia       Relevant Medications   ezetimibe (ZETIA) 10 MG tablet   chlorthalidone (HYGROTON) 25 MG tablet   ramipril (ALTACE) 10 MG capsule   Primary hypertension       Relevant Medications   ezetimibe (ZETIA) 10 MG tablet   chlorthalidone (HYGROTON) 25 MG tablet   ramipril (ALTACE) 10 MG capsule    Cervical radiculopathy at C5       Relevant Medications   gabapentin (NEURONTIN) 100 MG capsule   ALPRAZolam (XANAX) 0.25 MG tablet   Prediabetes           Return in about 3 months (around 06/24/2023) for AWV.   Total time spent: 20 minutes  Luna Fuse, MD  03/24/2023`   This document may have been prepared by [facility] Voice Recognition software and as such may include unintentional dictation errors.

## 2023-04-11 ENCOUNTER — Ambulatory Visit
Admission: RE | Admit: 2023-04-11 | Discharge: 2023-04-11 | Disposition: A | Discharge status: Home / Self Care | Payer: Medicare HMO | Source: Ambulatory Visit | Attending: Internal Medicine | Admitting: Internal Medicine

## 2023-04-11 DIAGNOSIS — Z1231 Encounter for screening mammogram for malignant neoplasm of breast: Secondary | ICD-10-CM | POA: Insufficient documentation

## 2023-05-30 DIAGNOSIS — M79672 Pain in left foot: Secondary | ICD-10-CM | POA: Diagnosis not present

## 2023-06-12 DIAGNOSIS — S93602D Unspecified sprain of left foot, subsequent encounter: Secondary | ICD-10-CM | POA: Diagnosis not present

## 2023-06-12 DIAGNOSIS — M79672 Pain in left foot: Secondary | ICD-10-CM | POA: Diagnosis not present

## 2023-06-18 DIAGNOSIS — H2513 Age-related nuclear cataract, bilateral: Secondary | ICD-10-CM | POA: Diagnosis not present

## 2023-06-18 DIAGNOSIS — H40033 Anatomical narrow angle, bilateral: Secondary | ICD-10-CM | POA: Diagnosis not present

## 2023-06-18 DIAGNOSIS — H40009 Preglaucoma, unspecified, unspecified eye: Secondary | ICD-10-CM | POA: Diagnosis not present

## 2023-06-18 DIAGNOSIS — H52203 Unspecified astigmatism, bilateral: Secondary | ICD-10-CM | POA: Diagnosis not present

## 2023-06-19 DIAGNOSIS — M79672 Pain in left foot: Secondary | ICD-10-CM | POA: Diagnosis not present

## 2023-06-19 DIAGNOSIS — M84375A Stress fracture, left foot, initial encounter for fracture: Secondary | ICD-10-CM | POA: Diagnosis not present

## 2023-06-19 DIAGNOSIS — M85872 Other specified disorders of bone density and structure, left ankle and foot: Secondary | ICD-10-CM | POA: Diagnosis not present

## 2023-06-20 ENCOUNTER — Other Ambulatory Visit: Payer: Self-pay | Admitting: Internal Medicine

## 2023-06-20 DIAGNOSIS — I10 Essential (primary) hypertension: Secondary | ICD-10-CM

## 2023-06-27 ENCOUNTER — Ambulatory Visit (INDEPENDENT_AMBULATORY_CARE_PROVIDER_SITE_OTHER): Payer: Medicare HMO | Admitting: Internal Medicine

## 2023-06-27 ENCOUNTER — Encounter: Payer: Self-pay | Admitting: Internal Medicine

## 2023-06-27 VITALS — BP 127/73 | HR 67 | Ht 64.0 in | Wt 150.4 lb

## 2023-06-27 DIAGNOSIS — Z1211 Encounter for screening for malignant neoplasm of colon: Secondary | ICD-10-CM

## 2023-06-27 DIAGNOSIS — M5412 Radiculopathy, cervical region: Secondary | ICD-10-CM | POA: Diagnosis not present

## 2023-06-27 DIAGNOSIS — M84375D Stress fracture, left foot, subsequent encounter for fracture with routine healing: Secondary | ICD-10-CM | POA: Diagnosis not present

## 2023-06-27 DIAGNOSIS — I10 Essential (primary) hypertension: Secondary | ICD-10-CM | POA: Diagnosis not present

## 2023-06-27 DIAGNOSIS — E7800 Pure hypercholesterolemia, unspecified: Secondary | ICD-10-CM | POA: Insufficient documentation

## 2023-06-27 DIAGNOSIS — Z0001 Encounter for general adult medical examination with abnormal findings: Secondary | ICD-10-CM

## 2023-06-27 DIAGNOSIS — F411 Generalized anxiety disorder: Secondary | ICD-10-CM | POA: Diagnosis not present

## 2023-06-27 MED ORDER — EZETIMIBE 10 MG PO TABS
10.0000 mg | ORAL_TABLET | Freq: Every day | ORAL | Status: DC
Start: 2023-06-27 — End: 2023-09-26

## 2023-06-27 MED ORDER — ALPRAZOLAM 0.25 MG PO TABS
0.2500 mg | ORAL_TABLET | Freq: Two times a day (BID) | ORAL | 2 refills | Status: DC | PRN
Start: 2023-06-27 — End: 2023-09-26

## 2023-06-27 MED ORDER — GABAPENTIN 100 MG PO CAPS
100.0000 mg | ORAL_CAPSULE | Freq: Three times a day (TID) | ORAL | 0 refills | Status: DC
Start: 2023-06-27 — End: 2023-09-26

## 2023-06-27 NOTE — Progress Notes (Signed)
Established Patient Office Visit  Subjective:  Patient ID: Jodi Goodman, female    DOB: 04-22-1951  Age: 72 y.o. MRN: [id number]  No chief complaint on file.   Interval stress fracture of her toe. No other complaints, here for AWV refer to quality metrics and scanned documents.  Also here medication refills.    No other concerns at this time.   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   Procedure: TOTAL KNEE ARTHROPLASTY;  Surgeon: Christena Flake, MD;  Location: [facility];  Service: Orthopedics;  Laterality: Left;   WISDOM TOOTH EXTRACTION      Social History   Socioeconomic History   Marital status: Single    Spouse name: Not on file   Number of children: Not on file   Years of education: Not on file   Highest education level: Not on file  Occupational History   Occupation: Janitorial services  Tobacco Use   Smoking status: Never   Smokeless tobacco: Never  Vaping Use   Vaping status: Never Used  Substance and Sexual Activity   Alcohol use: No   Drug use: No   Sexual activity: Not Currently  Other Topics Concern   Not on file  Social History Narrative   Not on file   Social Determinants of Health   Financial Resource Strain: Not on file  Food Insecurity: Not on file  Transportation Needs: Not on file  Physical Activity: Not on file  Stress: Not on file  Social  Connections: Not on file  Intimate Partner Violence: Not on file    Family History  Problem Relation Age of Onset   Stroke Brother    Heart disease Brother    Breast cancer Neg Hx     Allergies  Allergen Reactions   Latex Hives   Penicillins Hives    Did it involve swelling of the face/tongue/throat, SOB, or low BP? Unknown Did it involve sudden or severe rash/hives, skin peeling, or any reaction on the inside of your mouth or nose? Yes Did you need to seek medical attention at a hospital or doctor'Jodi Goodman office? No When did it last happen? Over 40 years ago      If all above answers are "NO", may proceed with cephalosporin use.   Tramadol Other (See Comments)    Dry mouth   Aspirin Nausea Only    Patient can tolerate coated aspirin   Sulfa Antibiotics Hives and Other (See Comments)    DRY MOUTH    Review of Systems  Constitutional: Negative.   HENT: Negative.    Eyes: Negative.   Respiratory: Negative.    Cardiovascular: Negative.   Gastrointestinal: Negative.   Genitourinary: Negative.   Musculoskeletal:  Positive for joint pain (left foot pain).  Skin: Negative.   Neurological: Negative.  Endo/Heme/Allergies: Negative.        Objective:   BP 127/73   Pulse 67   Ht 5\' 4"  (1.626 m)   Wt 150 lb 6.4 oz (68.2 kg)   SpO2 98%   BMI 25.82 kg/m   Vitals:   06/27/23 1403  BP: 127/73  Pulse: 67  Height: 5\' 4"  (1.626 m)  Weight: 150 lb 6.4 oz (68.2 kg)  SpO2: 98%  BMI (Calculated): 25.8    Physical Exam Vitals reviewed.  Constitutional:      General: She is not in acute distress. HENT:     Head: Normocephalic.     Nose: Nose normal.     Mouth/Throat:     Mouth: Mucous membranes are moist.  Eyes:     Extraocular Movements: Extraocular movements intact.     Pupils: Pupils are equal, round, and reactive to light.  Cardiovascular:     Rate and Rhythm: Normal rate and regular rhythm.     Heart sounds: No murmur heard. Pulmonary:     Effort: Pulmonary  effort is normal.     Breath sounds: No rhonchi or rales.  Abdominal:     General: Abdomen is flat.     Palpations: There is no hepatomegaly, splenomegaly or mass.  Musculoskeletal:        General: Normal range of motion.     Cervical back: Normal range of motion. No tenderness.  Skin:    General: Skin is warm and dry.  Neurological:     General: No focal deficit present.     Mental Status: She is alert and oriented to person, place, and time.     Cranial Nerves: No cranial nerve deficit.     Motor: No weakness.     Comments: No sensory impairment  Psychiatric:        Mood and Affect: Mood normal.        Behavior: Behavior normal.      No results found for any visits on 06/27/23.  No results found for this or any previous visit (from the past 2160 hour(Jodi Goodman)).    Assessment & Plan:  As per problem list.  Problem List Items Addressed This Visit       Cardiovascular and Mediastinum   Primary hypertension - Primary   Relevant Medications   ezetimibe (ZETIA) 10 MG tablet     Other   GAD (generalized anxiety disorder)   Relevant Medications   ALPRAZolam (XANAX) 0.25 MG tablet   Pure hypercholesterolemia   Relevant Medications   ezetimibe (ZETIA) 10 MG tablet   Other Visit Diagnoses     Stress fracture of metatarsal bone of left foot with routine healing, subsequent encounter       Cervical radiculopathy at C5       Relevant Medications   ALPRAZolam (XANAX) 0.25 MG tablet   gabapentin (NEURONTIN) 100 MG capsule   Encounter for screening colonoscopy       Relevant Orders   Ambulatory referral to Gastroenterology       Return in about 3 months (around 09/26/2023) for fu with labs prior.   Total time spent: 40 minutes  Luna Fuse, MD  06/27/2023   This document may have been prepared by [facility] Voice Recognition software and as such may include unintentional dictation errors.

## 2023-07-17 DIAGNOSIS — M84375D Stress fracture, left foot, subsequent encounter for fracture with routine healing: Secondary | ICD-10-CM | POA: Diagnosis not present

## 2023-07-22 ENCOUNTER — Other Ambulatory Visit: Payer: Self-pay

## 2023-08-06 ENCOUNTER — Telehealth: Payer: Self-pay | Admitting: Internal Medicine

## 2023-08-06 NOTE — Telephone Encounter (Signed)
Darel Hong, a health advocate with [facility], left VM that patient needs a bone density scan done due to her having a recent fracture. Please advise.  Callback # [phone]

## 2023-08-08 ENCOUNTER — Other Ambulatory Visit: Payer: Self-pay | Admitting: Internal Medicine

## 2023-08-08 DIAGNOSIS — Z780 Asymptomatic menopausal state: Secondary | ICD-10-CM

## 2023-08-18 NOTE — Telephone Encounter (Signed)
Patient contacted office and has decided to reach out to her insurance to verify the reason as to why she is needing a bone density scan; patient will reach out to office with decision on if she will do scan.

## 2023-08-21 DIAGNOSIS — Z780 Asymptomatic menopausal state: Secondary | ICD-10-CM | POA: Diagnosis not present

## 2023-08-21 NOTE — Telephone Encounter (Signed)
Pt called and stated she had her bone density scan done today at her house, she had someone come do it with a mobile devise and we should receive the results in about 4 weeks-HQ

## 2023-08-22 ENCOUNTER — Telehealth: Payer: Self-pay | Admitting: Internal Medicine

## 2023-08-22 NOTE — Telephone Encounter (Signed)
Left VM to see if we received a fax about patient needing a DEXA scan since she had a recent fracture. If so, is TJ going to order one?

## 2023-09-24 ENCOUNTER — Other Ambulatory Visit: Payer: Medicare HMO

## 2023-09-24 DIAGNOSIS — E7800 Pure hypercholesterolemia, unspecified: Secondary | ICD-10-CM | POA: Diagnosis not present

## 2023-09-25 LAB — HEPATIC FUNCTION PANEL
ALT: 18 [IU]/L (ref 0–32)
AST: 23 [IU]/L (ref 0–40)
Albumin: 4.4 g/dL (ref 3.8–4.8)
Alkaline Phosphatase: 149 [IU]/L — ABNORMAL HIGH (ref 44–121)
Bilirubin Total: 0.3 mg/dL (ref 0.0–1.2)
Bilirubin, Direct: 0.11 mg/dL (ref 0.00–0.40)
Total Protein: 6.6 g/dL (ref 6.0–8.5)

## 2023-09-25 LAB — LIPID PANEL
Chol/HDL Ratio: 3.2 {ratio} (ref 0.0–4.4)
Cholesterol, Total: 190 mg/dL (ref 100–199)
HDL: 59 mg/dL (ref >39)
LDL Chol Calc (NIH): 115 mg/dL — ABNORMAL HIGH (ref 0–99)
Triglycerides: 91 mg/dL (ref 0–149)
VLDL Cholesterol Cal: 16 mg/dL (ref 5–40)

## 2023-09-25 LAB — CK: Total CK: 124 U/L (ref 32–182)

## 2023-09-26 ENCOUNTER — Encounter: Payer: Self-pay | Admitting: Internal Medicine

## 2023-09-26 ENCOUNTER — Ambulatory Visit (INDEPENDENT_AMBULATORY_CARE_PROVIDER_SITE_OTHER): Payer: Medicare HMO | Admitting: Internal Medicine

## 2023-09-26 VITALS — BP 112/74 | HR 65 | Ht 64.0 in | Wt 155.0 lb

## 2023-09-26 DIAGNOSIS — F411 Generalized anxiety disorder: Secondary | ICD-10-CM | POA: Diagnosis not present

## 2023-09-26 DIAGNOSIS — M5412 Radiculopathy, cervical region: Secondary | ICD-10-CM | POA: Diagnosis not present

## 2023-09-26 DIAGNOSIS — E7800 Pure hypercholesterolemia, unspecified: Secondary | ICD-10-CM

## 2023-09-26 DIAGNOSIS — I10 Essential (primary) hypertension: Secondary | ICD-10-CM

## 2023-09-26 MED ORDER — CHLORTHALIDONE 25 MG PO TABS
12.5000 mg | ORAL_TABLET | Freq: Every day | ORAL | 1 refills | Status: AC
Start: 2023-09-26 — End: 2024-03-24

## 2023-09-26 MED ORDER — ALPRAZOLAM 0.25 MG PO TABS: 0.2500 mg | ORAL_TABLET | Freq: Two times a day (BID) | ORAL | 2 refills | Status: DC | PRN

## 2023-09-26 MED ORDER — EZETIMIBE 10 MG PO TABS
10.0000 mg | ORAL_TABLET | Freq: Every day | ORAL | Status: DC
Start: 2023-09-26 — End: 2023-12-29

## 2023-09-26 MED ORDER — GABAPENTIN 100 MG PO CAPS: 100.0000 mg | ORAL_CAPSULE | Freq: Three times a day (TID) | ORAL | 0 refills | Status: DC

## 2023-09-26 MED ORDER — ALPRAZOLAM 0.25 MG PO TABS
0.2500 mg | ORAL_TABLET | Freq: Two times a day (BID) | ORAL | 2 refills | Status: DC | PRN
Start: 2023-09-26 — End: 2023-12-29

## 2023-09-26 NOTE — Progress Notes (Signed)
Established Patient Office Visit  Subjective:  Patient ID: Jodi Goodman, female    DOB: March 24, 1951  Age: 72 y.o. MRN: [id number]  Chief Complaint  Patient presents with   Follow-up    No new complaints, here for lab review and medication refills. LDL elevated but TC well controlled on lab review. Triglycerides also satisfactory. CK normal with normal transaminases.  No other concerns at this time.   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   Procedure: TOTAL KNEE ARTHROPLASTY;  Surgeon: Christena Flake, MD;  Location: [facility];  Service: Orthopedics;  Laterality: Left;   WISDOM TOOTH EXTRACTION      Social History   Socioeconomic History   Marital status: Single    Spouse name: Not on file   Number of children: Not on file   Years of education: Not on file   Highest education level: Not on file  Occupational History   Occupation: Janitorial services  Tobacco Use   Smoking status: Never   Smokeless tobacco: Never  Vaping Use   Vaping status: Never Used  Substance and Sexual Activity   Alcohol use: No   Drug use: No   Sexual activity: Not Currently  Other Topics Concern   Not on file  Social History Narrative   Not on file   Social Determinants of Health   Financial Resource Strain: Not on file  Food Insecurity: Not on file  Transportation Needs: Not on file  Physical  Activity: Not on file  Stress: Not on file  Social Connections: Not on file  Intimate Partner Violence: Not on file    Family History  Problem Relation Age of Onset   Stroke Brother    Heart disease Brother    Breast cancer Neg Hx     Allergies  Allergen Reactions   Latex Hives   Penicillins Hives    Did it involve swelling of the face/tongue/throat, SOB, or low BP? Unknown Did it involve sudden or severe rash/hives, skin peeling, or any reaction on the inside of your mouth or nose? Yes Did you need to seek medical attention at a hospital or doctor'Niasha Devins office? No When did it last happen? Over 40 years ago      If all above answers are "NO", may proceed with cephalosporin use.   Tramadol Other (See Comments)    Dry mouth   Aspirin Nausea Only    Patient can tolerate coated aspirin   Sulfa Antibiotics Hives and Other (See Comments)    DRY MOUTH    Outpatient Medications Prior to Visit  Medication Sig   ASPERCREME LIDOCAINE EX Apply 1 application topically 3 (three) times daily as needed (pain.).   calcium carbonate (TUMS EX) 750 MG chewable tablet Chew 1 tablet by mouth 3 (three) times daily as needed  for heartburn.   chlorthalidone (HYGROTON) 25 MG tablet Take 0.5 tablets (12.5 mg total) by mouth at bedtime.   ezetimibe (ZETIA) 10 MG tablet Take 1 tablet (10 mg total) by mouth daily with lunch.   famotidine (PEPCID) 20 MG tablet Take 20 mg by mouth 2 (two) times daily as needed for heartburn or indigestion.   gabapentin (NEURONTIN) 100 MG capsule Take 1 capsule (100 mg total) by mouth 3 (three) times daily.   Multiple Vitamins-Minerals (ADULT GUMMY PO) Take 2 tablets by mouth daily with lunch. One-A-Day Women Gummy   ramipril (ALTACE) 10 MG capsule Take 1 capsule by mouth at bedtime   [DISCONTINUED] magic mouthwash w/lidocaine SOLN Take 5 mLs by mouth 4 (four) times daily as needed for mouth pain. Swish and spit, do not swallow the solution. (Patient not taking: Reported on  12/20/2022)   [DISCONTINUED] ondansetron (ZOFRAN) 4 MG tablet Take 1 tablet (4 mg total) by mouth every 6 (six) hours as needed for nausea. (Patient not taking: Reported on 12/20/2022)   [DISCONTINUED] Polyethyl Glycol-Propyl Glycol (SYSTANE) 0.4-0.3 % SOLN Place 1 drop into both eyes 3 (three) times daily as needed (dry/irritated eyes.). (Patient not taking: Reported on 12/20/2022)   No facility-administered medications prior to visit.    Review of Systems  Constitutional: Negative.   HENT: Negative.    Eyes: Negative.   Respiratory: Negative.    Cardiovascular: Negative.   Gastrointestinal: Negative.   Genitourinary: Negative.   Musculoskeletal:  Positive for joint pain (left foot pain).  Skin: Negative.   Neurological: Negative.   Endo/Heme/Allergies: Negative.        Objective:   BP 112/74   Pulse 65   Ht 5\' 4"  (1.626 m)   Wt 155 lb (70.3 kg)   SpO2 98%   BMI 26.61 kg/m   Vitals:   09/26/23 1407  BP: 112/74  Pulse: 65  Height: 5\' 4"  (1.626 m)  Weight: 155 lb (70.3 kg)  SpO2: 98%  BMI (Calculated): 26.59    Physical Exam Vitals reviewed.  Constitutional:      General: She is not in acute distress. HENT:     Head: Normocephalic.     Nose: Nose normal.     Mouth/Throat:     Mouth: Mucous membranes are moist.  Eyes:     Extraocular Movements: Extraocular movements intact.     Pupils: Pupils are equal, round, and reactive to light.  Cardiovascular:     Rate and Rhythm: Normal rate and regular rhythm.     Heart sounds: No murmur heard. Pulmonary:     Effort: Pulmonary effort is normal.     Breath sounds: No rhonchi or rales.  Abdominal:     General: Abdomen is flat.     Palpations: There is no hepatomegaly, splenomegaly or mass.  Musculoskeletal:        General: Normal range of motion.     Cervical back: Normal range of motion. No tenderness.  Skin:    General: Skin is warm and dry.  Neurological:     General: No focal deficit present.     Mental Status:  She is alert and oriented to person, place, and time.     Cranial Nerves: No cranial nerve deficit.     Motor: No weakness.     Comments: No sensory impairment  Psychiatric:        Mood and Affect: Mood normal.        Behavior: Behavior normal.      No  results found for any visits on 09/26/23.  Recent Results (from the past 2160 hour(Kanda Deluna))  Lipid panel     Status: Abnormal   Collection Time: 09/24/23 10:11 AM  Result Value Ref Range   Cholesterol, Total 190 100 - 199 mg/dL   Triglycerides 91 0 - 149 mg/dL   HDL 59 >95 mg/dL   VLDL Cholesterol Cal 16 5 - 40 mg/dL   LDL Chol Calc (NIH) 284 (H) 0 - 99 mg/dL   Chol/HDL Ratio 3.2 0.0 - 4.4 ratio    Comment:                                   T. Chol/HDL Ratio                                             Men  Women                               1/2 Avg.Risk  3.4    3.3                                   Avg.Risk  5.0    4.4                                2X Avg.Risk  9.6    7.1                                3X Avg.Risk 23.4   11.0   CK     Status: None   Collection Time: 09/24/23 10:13 AM  Result Value Ref Range   Total CK 124 32 - 182 U/L  Hepatic function panel     Status: Abnormal   Collection Time: 09/24/23 10:16 AM  Result Value Ref Range   Total Protein 6.6 6.0 - 8.5 g/dL   Albumin 4.4 3.8 - 4.8 g/dL   Bilirubin Total 0.3 0.0 - 1.2 mg/dL   Bilirubin, Direct 1.32 0.00 - 0.40 mg/dL   Alkaline Phosphatase 149 (H) 44 - 121 IU/L   AST 23 0 - 40 IU/L   ALT 18 0 - 32 IU/L      Assessment & Plan:  As per problem list  Problem List Items Addressed This Visit       Cardiovascular and Mediastinum   Primary hypertension     Other   GAD (generalized anxiety disorder)   Pure hypercholesterolemia - Primary   Other Visit Diagnoses     Cervical radiculopathy at C5           No follow-ups on file.   Total time spent: 20 minutes  Luna Fuse, MD  09/26/2023   This document may have been prepared by  [facility] Voice Recognition software and as such may include unintentional dictation errors.

## 2023-12-19 ENCOUNTER — Encounter: Payer: Self-pay | Admitting: Gastroenterology

## 2023-12-29 ENCOUNTER — Ambulatory Visit (INDEPENDENT_AMBULATORY_CARE_PROVIDER_SITE_OTHER): Payer: Medicare HMO | Admitting: Internal Medicine

## 2023-12-29 ENCOUNTER — Encounter: Payer: Self-pay | Admitting: Internal Medicine

## 2023-12-29 DIAGNOSIS — M5412 Radiculopathy, cervical region: Secondary | ICD-10-CM

## 2023-12-29 DIAGNOSIS — E782 Mixed hyperlipidemia: Secondary | ICD-10-CM | POA: Diagnosis not present

## 2023-12-29 DIAGNOSIS — I10 Essential (primary) hypertension: Secondary | ICD-10-CM

## 2023-12-29 DIAGNOSIS — F411 Generalized anxiety disorder: Secondary | ICD-10-CM | POA: Diagnosis not present

## 2023-12-29 DIAGNOSIS — E7800 Pure hypercholesterolemia, unspecified: Secondary | ICD-10-CM

## 2023-12-29 MED ORDER — GABAPENTIN 100 MG PO CAPS
100.0000 mg | ORAL_CAPSULE | Freq: Three times a day (TID) | ORAL | 0 refills | Status: DC
Start: 2023-12-29 — End: 2024-07-16

## 2023-12-29 MED ORDER — ALPRAZOLAM 0.25 MG PO TABS: 0.2500 mg | ORAL_TABLET | Freq: Two times a day (BID) | ORAL | 0 refills | Status: DC | PRN

## 2023-12-29 MED ORDER — RAMIPRIL 10 MG PO CAPS
10.0000 mg | ORAL_CAPSULE | Freq: Every day | ORAL | 1 refills | Status: DC
Start: 2023-12-29 — End: 2024-06-29

## 2023-12-29 MED ORDER — EZETIMIBE 10 MG PO TABS: 10.0000 mg | ORAL_TABLET | Freq: Every day | ORAL | Status: DC

## 2023-12-29 NOTE — Progress Notes (Signed)
 Established Patient Office Visit  Subjective:  Patient ID: Jodi Goodman, female    DOB: 01-Oct-1951  Age: 73 y.o. MRN: [id number]  Chief Complaint  Patient presents with   Follow-up    3 month follow up     No new complaints, here for lab review and medication refills. Treated for UTI with fosfomycin. Mood stable on current anxiolytics.    No other concerns at this time.   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   Procedure: TOTAL KNEE ARTHROPLASTY;  Surgeon: Christena Flake, MD;  Location: [facility];  Service: Orthopedics;  Laterality: Left;   WISDOM TOOTH EXTRACTION      Social History   Socioeconomic History   Marital status: Single    Spouse name: Not on file   Number of children: Not on file   Years of education: Not on file   Highest education level: Not on file  Occupational History   Occupation: Janitorial services  Tobacco Use   Smoking status: Never   Smokeless tobacco: Never  Vaping Use   Vaping status: Never Used  Substance and Sexual Activity   Alcohol use: No   Drug use: No   Sexual activity: Not Currently  Other Topics Concern   Not on file  Social History Narrative   Not on file   Social Drivers of Health   Financial Resource Strain: Medium Risk (11/10/2023)   Received from [facility]   Overall Financial Resource Strain (CARDIA)     Difficulty of Paying Living Expenses: Somewhat hard  Food Insecurity: Food Insecurity Present (11/10/2023)   Received from [facility]   Hunger Vital Sign    Worried About Running Out of Food in the Last Year: Sometimes true    Ran Out of Food in the Last Year: Sometimes true  Transportation Needs: No Transportation Needs (11/10/2023)   Received from [facility] - Transportation    In the past 12 months, has lack of transportation kept you from medical appointments or from getting medications?: No    Lack of Transportation (Non-Medical): No  Physical Activity: Not on file  Stress: Not on file  Social Connections: Not on file  Intimate Partner Violence: Not on file    Family History  Problem Relation Age of Onset   Stroke Brother    Heart disease Brother    Breast cancer Neg Hx     Allergies  Allergen Reactions   Latex Hives   Penicillins Hives    Did it involve swelling of the face/tongue/throat, SOB, or low BP? Unknown Did it involve sudden or severe rash/hives, skin peeling, or any reaction on the inside of your mouth or nose? Yes Did you need to seek medical attention at a hospital or  doctor'Brendan Gruwell office? No When did it last happen? Over 40 years ago      If all above answers are "NO", may proceed with cephalosporin use.   Tramadol Other (See Comments)    Dry mouth   Aspirin Nausea Only    Patient can tolerate coated aspirin   Sulfa Antibiotics Hives and Other (See Comments)    DRY MOUTH    Outpatient Medications Prior to Visit  Medication Sig   ASPERCREME LIDOCAINE EX Apply 1 application topically 3 (three) times daily as needed (pain.).   calcium carbonate (TUMS EX) 750 MG chewable tablet Chew 1 tablet by mouth 3 (three) times daily as needed for heartburn.   chlorthalidone (HYGROTON) 25 MG tablet Take 0.5 tablets (12.5 mg total) by mouth at bedtime.   famotidine (PEPCID) 20 MG tablet Take 20 mg by mouth 2 (two) times daily  as needed for heartburn or indigestion.   Multiple Vitamins-Minerals (ADULT GUMMY PO) Take 2 tablets by mouth daily with lunch. One-A-Day Women Gummy   [DISCONTINUED] ALPRAZolam (XANAX) 0.25 MG tablet Take 1 tablet (0.25 mg total) by mouth 2 (two) times daily as needed for anxiety.   [DISCONTINUED] ALPRAZolam (XANAX) 0.25 MG tablet Take 1 tablet (0.25 mg total) by mouth 2 (two) times daily as needed for anxiety.   [DISCONTINUED] ezetimibe (ZETIA) 10 MG tablet Take 1 tablet (10 mg total) by mouth daily with lunch.   [DISCONTINUED] gabapentin (NEURONTIN) 100 MG capsule Take 1 capsule (100 mg total) by mouth 3 (three) times daily.   [DISCONTINUED] ramipril (ALTACE) 10 MG capsule Take 1 capsule by mouth at bedtime   No facility-administered medications prior to visit.    Review of Systems  Constitutional:  Positive for weight loss (2 lbs).  HENT: Negative.    Eyes: Negative.   Respiratory: Negative.    Cardiovascular: Negative.   Gastrointestinal: Negative.   Genitourinary: Negative.   Musculoskeletal:  Positive for joint pain (left foot pain).  Skin: Negative.   Neurological: Negative.   Endo/Heme/Allergies: Negative.        Objective:   BP 138/87   Pulse 66   Ht 5\' 4"  (1.626 m)   Wt 153 lb (69.4 kg)   SpO2 99%   BMI 26.26 kg/m   Vitals:   12/29/23 1402  BP: 138/87  Pulse: 66  Height: 5\' 4"  (1.626 m)  Weight: 153 lb (69.4 kg)  SpO2: 99%  BMI (Calculated): 26.25    Physical Exam Vitals reviewed.  Constitutional:      General: She is not in acute distress. HENT:     Head: Normocephalic.     Nose: Nose normal.     Mouth/Throat:     Mouth: Mucous membranes are moist.  Eyes:     Extraocular Movements: Extraocular movements intact.     Pupils: Pupils are equal, round, and reactive to light.  Cardiovascular:     Rate and Rhythm: Normal rate and regular rhythm.     Heart sounds: No murmur heard. Pulmonary:     Effort: Pulmonary effort is normal.     Breath sounds:  No rhonchi or rales.  Abdominal:     General: Abdomen is flat.     Palpations: There is no hepatomegaly, splenomegaly or mass.  Musculoskeletal:        General: Normal range of motion.     Cervical back: Normal range of motion. No tenderness.  Skin:    General: Skin is warm and dry.  Neurological:     General:  No focal deficit present.     Mental Status: She is alert and oriented to person, place, and time.     Cranial Nerves: No cranial nerve deficit.     Motor: No weakness.     Comments: No sensory impairment  Psychiatric:        Mood and Affect: Mood normal.        Behavior: Behavior normal.      No results found for any visits on 12/29/23.  No results found for this or any previous visit (from the past 2160 hours).    Assessment & Plan:  As per problem list  Problem List Items Addressed This Visit       Cardiovascular and Mediastinum   Primary hypertension   Relevant Medications   ezetimibe (ZETIA) 10 MG tablet   ramipril (ALTACE) 10 MG capsule     Other   GAD (generalized anxiety disorder)   Relevant Medications   ALPRAZolam (XANAX) 0.25 MG tablet   Pure hypercholesterolemia   Relevant Medications   ezetimibe (ZETIA) 10 MG tablet   ramipril (ALTACE) 10 MG capsule   Other Relevant Orders   Lipid panel   CK   Hepatic function panel   Other Visit Diagnoses       Cervical radiculopathy at C5       Relevant Medications   gabapentin (NEURONTIN) 100 MG capsule   ALPRAZolam (XANAX) 0.25 MG tablet       Return in about 3 months (around 03/30/2024) for fu with labs prior.   Total time spent: 20 minutes  Luna Fuse, MD  12/29/2023   This document may have been prepared by [facility] Voice Recognition software and as such may include unintentional dictation errors.

## 2024-01-01 ENCOUNTER — Encounter: Payer: Self-pay | Admitting: Gastroenterology

## 2024-01-02 ENCOUNTER — Ambulatory Visit
Admission: RE | Admit: 2024-01-02 | Discharge: 2024-01-02 | Disposition: A | Discharge status: Home / Self Care | Payer: Medicare HMO | Attending: Gastroenterology | Admitting: Gastroenterology

## 2024-01-02 ENCOUNTER — Other Ambulatory Visit: Payer: Self-pay

## 2024-01-02 ENCOUNTER — Encounter: Payer: Self-pay | Admitting: Gastroenterology

## 2024-01-02 ENCOUNTER — Ambulatory Visit: Admitting: Anesthesiology

## 2024-01-02 ENCOUNTER — Encounter
Admission: RE | Disposition: A | Discharge status: Home / Self Care | Payer: Self-pay | Source: Home / Self Care | Attending: Gastroenterology

## 2024-01-02 DIAGNOSIS — Z1211 Encounter for screening for malignant neoplasm of colon: Secondary | ICD-10-CM | POA: Insufficient documentation

## 2024-01-02 DIAGNOSIS — I10 Essential (primary) hypertension: Secondary | ICD-10-CM | POA: Insufficient documentation

## 2024-01-02 DIAGNOSIS — K219 Gastro-esophageal reflux disease without esophagitis: Secondary | ICD-10-CM | POA: Diagnosis not present

## 2024-01-02 DIAGNOSIS — K5730 Diverticulosis of large intestine without perforation or abscess without bleeding: Secondary | ICD-10-CM | POA: Diagnosis not present

## 2024-01-02 DIAGNOSIS — D122 Benign neoplasm of ascending colon: Secondary | ICD-10-CM | POA: Diagnosis not present

## 2024-01-02 HISTORY — PX: COLONOSCOPY WITH PROPOFOL: SHX5780

## 2024-01-02 HISTORY — PX: POLYPECTOMY: SHX5525

## 2024-01-02 SURGERY — COLONOSCOPY WITH PROPOFOL
Anesthesia: General

## 2024-01-02 MED ORDER — SODIUM CHLORIDE 0.9 % IV SOLN: INTRAVENOUS | Status: DC

## 2024-01-02 MED ORDER — LIDOCAINE HCL (PF) 2 % IJ SOLN
INTRAMUSCULAR | Status: AC
Start: 2024-01-02 — End: ?
  Filled 2024-01-02: qty 5

## 2024-01-02 MED ORDER — EPHEDRINE 5 MG/ML INJ
INTRAVENOUS | Status: AC
  Filled 2024-01-02: qty 5

## 2024-01-02 MED ORDER — LIDOCAINE HCL (CARDIAC) PF 100 MG/5ML IV SOSY
PREFILLED_SYRINGE | INTRAVENOUS | Status: DC | PRN
  Administered 2024-01-02: 60 mg via INTRAVENOUS

## 2024-01-02 MED ORDER — EPHEDRINE SULFATE-NACL 50-0.9 MG/10ML-% IV SOSY
PREFILLED_SYRINGE | INTRAVENOUS | Status: DC | PRN
  Administered 2024-01-02 (×2): 10 mg via INTRAVENOUS

## 2024-01-02 MED ORDER — PROPOFOL 10 MG/ML IV BOLUS
INTRAVENOUS | Status: DC | PRN
  Administered 2024-01-02: 50 mg via INTRAVENOUS
  Administered 2024-01-02: 10 mg via INTRAVENOUS

## 2024-01-02 MED ORDER — DEXMEDETOMIDINE HCL IN NACL 80 MCG/20ML IV SOLN
INTRAVENOUS | Status: DC | PRN
Start: 2024-01-02 — End: 2024-01-02
  Administered 2024-01-02: 20 ug via INTRAVENOUS

## 2024-01-02 MED ORDER — PROPOFOL 1000 MG/100ML IV EMUL
INTRAVENOUS | Status: AC
Start: 2024-01-02 — End: ?
  Filled 2024-01-02: qty 100

## 2024-01-02 MED ORDER — PROPOFOL 500 MG/50ML IV EMUL
INTRAVENOUS | Status: DC | PRN
  Administered 2024-01-02: 75 ug/kg/min via INTRAVENOUS

## 2024-01-02 NOTE — H&P (Signed)
 Pre-Procedure H&P   Patient ID: Jodi Goodman is a 73 y.o. female.  Gastroenterology Provider: Jaynie Collins, DO  Referring Provider: Tawni Pummel, PA PCP: Sherron Monday, MD  Date: 01/02/2024  HPI [name] is a 73 y.o. female who presents today for Colonoscopy for Personal history of colon polyps .  Patient with a history of remote colonoscopy with reported 3 polyps.  No current GI symptoms.  Hemoglobin 13 MCV 93 creatinine 0.7   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  HENT:  Negative for trouble swallowing and voice change.   Respiratory:  Negative for shortness of breath and wheezing.   Cardiovascular:  Negative for chest pain, palpitations and leg swelling.  Gastrointestinal:  Negative for abdominal distention, abdominal pain, anal bleeding, blood in stool, constipation, diarrhea, nausea, rectal pain and vomiting.  Musculoskeletal:  Negative for arthralgias and myalgias.  Skin:  Negative for  color change and pallor.  Neurological:  Negative for dizziness, syncope and weakness.  Psychiatric/Behavioral:  Negative for confusion.   All other systems reviewed and are negative.    Medications No current facility-administered medications on file prior to encounter.   Current Outpatient Medications on File Prior to Encounter  Medication Sig Dispense Refill   ASPERCREME LIDOCAINE EX Apply 1 application topically 3 (three) times daily as needed (pain.).     calcium carbonate (TUMS EX) 750 MG chewable tablet Chew 1 tablet by mouth 3 (three) times daily as needed for heartburn.     chlorthalidone (HYGROTON) 25 MG tablet Take 0.5 tablets (12.5 mg total) by mouth at bedtime. 90 tablet 1   famotidine (PEPCID) 20 MG tablet Take 20 mg by mouth 2 (two) times daily as needed for heartburn or indigestion. (Patient not taking: Reported on 01/02/2024)     Multiple Vitamins-Minerals (ADULT GUMMY PO) Take 2 tablets by mouth daily with lunch. One-A-Day Women Gummy      Pertinent medications related to GI and procedure were reviewed by me with the patient prior to the procedure   Current Facility-Administered Medications:    0.9 %  sodium chloride infusion, , Intravenous, Continuous, Jaynie Collins, DO  sodium chloride         Allergies  Allergen Reactions   Latex Hives   Penicillins Hives    Did it involve swelling of the face/tongue/throat, SOB, or low BP? Unknown Did it involve sudden or severe rash/hives, skin peeling, or any reaction  on the inside of your mouth or nose? Yes Did you need to seek medical attention at a hospital or doctor's office? No When did it last happen? Over 40 years ago      If all above answers are "NO", may proceed with cephalosporin use.   Tramadol Other (See Comments)    Dry mouth   Aspirin Nausea Only    Patient can tolerate coated aspirin   Sulfa Antibiotics Hives and Other (See Comments)    DRY MOUTH   Allergies were reviewed by me prior to the  procedure  Objective   Body mass index is 24.55 kg/m. Vitals:   01/02/24 0715  BP: (!) 147/95  Pulse: 77  Resp: 16  Temp: (!) 97.1 F (36.2 C)  TempSrc: Temporal  SpO2: 98%  Weight: 64.9 kg  Height: 5\' 4"  (1.626 m)     Physical Exam Vitals and nursing note reviewed.  Constitutional:      General: She is not in acute distress.    Appearance: Normal appearance. She is not ill-appearing, toxic-appearing or diaphoretic.  HENT:     Head: Normocephalic and atraumatic.     Nose: Nose normal.     Mouth/Throat:     Mouth: Mucous membranes are moist.     Pharynx: Oropharynx is clear.  Eyes:     General: No scleral icterus.    Extraocular Movements: Extraocular movements intact.  Cardiovascular:     Rate and Rhythm: Normal rate and regular rhythm.     Heart sounds: Normal heart sounds. No murmur heard.    No friction rub. No gallop.  Pulmonary:     Effort: Pulmonary effort is normal. No respiratory distress.     Breath sounds: Normal breath sounds. No wheezing, rhonchi or rales.  Abdominal:     General: Bowel sounds are normal. There is no distension.     Palpations: Abdomen is soft.     Tenderness: There is no abdominal tenderness. There is no guarding or rebound.  Musculoskeletal:     Cervical back: Neck supple.     Right lower leg: No edema.     Left lower leg: No edema.  Skin:    General: Skin is warm and dry.     Coloration: Skin is not jaundiced or pale.  Neurological:     General: No focal deficit present.     Mental Status: She is alert and oriented to person, place, and time. Mental status is at baseline.  Psychiatric:        Mood and Affect: Mood normal.        Behavior: Behavior normal.        Thought Content: Thought content normal.        Judgment: Judgment normal.      Assessment:  [name] is a 73 y.o. female  who presents today for Colonoscopy for Personal history of colon polyps .  Plan:  Colonoscopy with possible intervention  today  Colonoscopy with possible biopsy, control of bleeding, polypectomy, and interventions as necessary has been discussed with the patient/patient representative. Informed consent was obtained from the patient/patient representative after explaining the indication, nature, and risks of the procedure including but not limited to death, bleeding, perforation, missed neoplasm/lesions, cardiorespiratory compromise, and reaction to medications. Opportunity for questions was given and appropriate answers were provided. Patient/patient representative has verbalized understanding is amenable to undergoing the procedure.   Jaynie Collins, DO  [facility] Gastroenterology  Portions of the record may have been  created with voice recognition software. Occasional wrong-word or 'sound-a-like' substitutions may have occurred due to the inherent limitations of voice recognition software.  Read the chart carefully and recognize, using context, where substitutions may have occurred.

## 2024-01-02 NOTE — Op Note (Signed)
 [facility] Gastroenterology Patient Name: Jodi Goodman Procedure Date: 01/02/2024 7:08 AM MRN: [id number] Account #: [id number] Date of Birth: Jun 22, 1951 Admit Type: Outpatient Age: 73 Room: [facility] ENDO ROOM 1 Gender: Female Note Status: Finalized Instrument Name: Colonoscope [id number] Procedure:             Colonoscopy Indications:           High risk colon cancer surveillance: Personal history                         of colonic polyps Providers:             Trenda Moots, DO Referring MD:          Silas Flood. Ellsworth Lennox, MD (Referring MD) Medicines:             Monitored Anesthesia Care Complications:         No immediate complications. Estimated blood loss:                         Minimal. Procedure:             Pre-Anesthesia Assessment:                        - Prior to the procedure, a History and Physical was                         performed, and patient medications and allergies were                         reviewed. The patient is competent. The risks and                         benefits of the procedure and the sedation options and                         risks were discussed with the patient. All questions                         were answered and informed consent was obtained.                         Patient identification and proposed procedure were                         verified by the physician, the nurse, the anesthetist                         and the technician in the endoscopy suite. Mental                         Status Examination: alert and oriented. Airway                         Examination: normal oropharyngeal airway and neck                         mobility. Respiratory Examination: clear to  auscultation. CV Examination: RRR, no murmurs, no S3                         or S4. Prophylactic Antibiotics: The patient does not                         require prophylactic antibiotics. Prior                          Anticoagulants: The patient has taken no anticoagulant                         or antiplatelet agents. ASA Grade Assessment: II - A                         patient with mild systemic disease. After reviewing                         the risks and benefits, the patient was deemed in                         satisfactory condition to undergo the procedure. The                         anesthesia plan was to use monitored anesthesia care                         (MAC). Immediately prior to administration of                         medications, the patient was re-assessed for adequacy                         to receive sedatives. The heart rate, respiratory                         rate, oxygen saturations, blood pressure, adequacy of                         pulmonary ventilation, and response to care were                         monitored throughout the procedure. The physical                         status of the patient was re-assessed after the                         procedure.                        After obtaining informed consent, the colonoscope was                         passed under direct vision. Throughout the procedure,                         the patient's blood pressure, pulse, and oxygen  saturations were monitored continuously. The                         Colonoscope was introduced through the anus and                         advanced to the the terminal ileum, with                         identification of the appendiceal orifice and IC                         valve. The colonoscopy was performed without                         difficulty. The patient tolerated the procedure well.                         The quality of the bowel preparation was evaluated                         using the BBPS [facility] Bowel Preparation Scale) with                         scores of: Right Colon = 3, Transverse Colon = 3 and                         Left Colon = 3 (entire  mucosa seen well with no                         residual staining, small fragments of stool or opaque                         liquid). The total BBPS score equals 9. The terminal                         ileum, ileocecal valve, appendiceal orifice, and                         rectum were photographed. Findings:      The perianal and digital rectal examinations were normal. Pertinent       negatives include normal sphincter tone.      The terminal ileum appeared normal. Estimated blood loss: none.      Retroflexion in the right colon was performed.      A 1 to 2 mm polyp was found in the ascending colon. The polyp was       sessile. The polyp was removed with a jumbo cold forceps. Resection and       retrieval were complete. Estimated blood loss was minimal.      Multiple small-mouthed diverticula were found in the sigmoid colon.       Estimated blood loss: none.      Despite multiple attempts of retroflexion in the rectum, this could not       be performed seemingly due to rectal stiffness. Rectum was examined in       foward view with no findings. Estimated blood loss was minimal.      The exam was  otherwise without abnormality on direct and retroflexion       views. Impression:            - The examined portion of the ileum was normal.                        - One 1 to 2 mm polyp in the ascending colon, removed                         with a jumbo cold forceps. Resected and retrieved.                        - Diverticulosis in the sigmoid colon.                        - The examination was otherwise normal on direct and                         retroflexion views. Recommendation:        - Patient has a contact number available for                         emergencies. The signs and symptoms of potential                         delayed complications were discussed with the patient.                         Return to normal activities tomorrow. Written                         discharge  instructions were provided to the patient.                        - Discharge patient to home.                        - Resume previous diet.                        - Continue present medications.                        - Await pathology results.                        - Repeat colonoscopy for surveillance based on                         pathology results.                        - Likely last colonoscopy given age and only one polyp                         on this exam (next would be due in 7 years)                        - Return to referring physician as previously  scheduled.                        - The findings and recommendations were discussed with                         the patient. Procedure Code(s):     --- Professional ---                        [phone], Colonoscopy, flexible; with biopsy, single or                         multiple Diagnosis Code(s):     --- Professional ---                        Z86.010, Personal history of colonic polyps                        D12.2, Benign neoplasm of ascending colon                        K57.30, Diverticulosis of large intestine without                         perforation or abscess without bleeding CPT copyright 2022 American [employer] Association. All rights reserved. The codes documented in this report are preliminary and upon coder review may  be revised to meet current compliance requirements. Attending Participation:      I personally performed the entire procedure. Elfredia Nevins, DO Jaynie Collins DO, DO 01/02/2024 8:07:57 AM This report has been signed electronically. Number of Addenda: 0 Note Initiated On: 01/02/2024 7:08 AM Scope Withdrawal Time: 0 hours 13 minutes 7 seconds  Total Procedure Duration: 0 hours 19 minutes 15 seconds  Estimated Blood Loss:  Estimated blood loss was minimal.      [facility]

## 2024-01-02 NOTE — Anesthesia Postprocedure Evaluation (Signed)
 Anesthesia Post Note  Patient: Jodi Goodman  Procedure(s) Performed: COLONOSCOPY WITH PROPOFOL POLYPECTOMY  Patient location during evaluation: Endoscopy Anesthesia Type: General Level of consciousness: awake and alert Pain management: pain level controlled Vital Signs Assessment: post-procedure vital signs reviewed and stable Respiratory status: spontaneous breathing, nonlabored ventilation, respiratory function stable and patient connected to nasal cannula oxygen Cardiovascular status: blood pressure returned to baseline and stable Postop Assessment: no apparent nausea or vomiting Anesthetic complications: no  No notable events documented.   Last Vitals:  Vitals:   01/02/24 0715 01/02/24 0804  BP: (!) 147/95   Pulse: 77   Resp: 16   Temp: (!) 36.2 C (!) 36.1 C  SpO2: 98%     Last Pain:  Vitals:   01/02/24 0824  TempSrc:   PainSc: 0-No pain                 Stephanie Coup

## 2024-01-02 NOTE — Anesthesia Preprocedure Evaluation (Signed)
 Anesthesia Evaluation  Patient identified by MRN, date of birth, ID band Patient awake    Reviewed: Allergy & Precautions, NPO status , Patient's Chart, lab work & pertinent test results  History of Anesthesia Complications Negative for: history of anesthetic complications  Airway Mallampati: II  TM Distance: >3 FB Neck ROM: Full    Dental  (+) Edentulous Upper, Edentulous Lower   Pulmonary neg pulmonary ROS, neg sleep apnea, neg COPD   breath sounds clear to auscultation- rhonchi (-) wheezing      Cardiovascular hypertension, Pt. on medications (-) CAD, (-) Past MI, (-) Cardiac Stents and (-) CABG  Rhythm:Regular Rate:Normal - Systolic murmurs and - Diastolic murmurs    Neuro/Psych neg Seizures PSYCHIATRIC DISORDERS Anxiety     negative neurological ROS     GI/Hepatic Neg liver ROS,GERD  Medicated and Controlled,,  Endo/Other  negative endocrine ROS    Renal/GU      Musculoskeletal   Abdominal   Peds  Hematology  (+) Blood dyscrasia, anemia   Anesthesia Other Findings Past Medical History: No date: Anemia     Comment:  H/O No date: Anxiety No date: Arthritis No date: Bruises easily No date: Dysrhythmia     Comment:  PALPITATIONS No date: GERD (gastroesophageal reflux disease) No date: Hyperlipidemia No date: Hypertension   Reproductive/Obstetrics                             Anesthesia Physical Anesthesia Plan  ASA: 2  Anesthesia Plan: General   Post-op Pain Management: Minimal or no pain anticipated   Induction: Intravenous  PONV Risk Score and Plan: 3 and Propofol infusion, TIVA and Ondansetron  Airway Management Planned: Nasal Cannula  Additional Equipment: None  Intra-op Plan:   Post-operative Plan:   Informed Consent: I have reviewed the patients History and Physical, chart, labs and discussed the procedure including the risks, benefits and alternatives for the  proposed anesthesia with the patient or authorized representative who has indicated his/her understanding and acceptance.     Dental advisory given  Plan Discussed with: CRNA and Surgeon  Anesthesia Plan Comments: (Discussed risks of anesthesia with patient, including possibility of difficulty with spontaneous ventilation under anesthesia necessitating airway intervention, PONV, and rare risks such as cardiac or respiratory or neurological events, and allergic reactions. Discussed the role of CRNA in patient's perioperative care. Patient understands.)       Anesthesia Quick Evaluation

## 2024-01-02 NOTE — Interval H&P Note (Signed)
 History and Physical Interval Note: Preprocedure H&P from 01/02/24  was reviewed and there was no interval change after seeing and examining the patient.  Written consent was obtained from the patient after discussion of risks, benefits, and alternatives. Patient has consented to proceed with Colonoscopy with possible intervention   01/02/2024 7:27 AM  Jodi Goodman  has presented today for surgery, with the diagnosis of Z86.0100 (ICD-10-CM) - Personal history of colon polyps, unspecified.  The various methods of treatment have been discussed with the patient and family. After consideration of risks, benefits and other options for treatment, the patient has consented to  Procedure(s): COLONOSCOPY WITH PROPOFOL (N/A) as a surgical intervention.  The patient's history has been reviewed, patient examined, no change in status, stable for surgery.  I have reviewed the patient's chart and labs.  Questions were answered to the patient's satisfaction.     Jaynie Collins

## 2024-01-02 NOTE — Transfer of Care (Signed)
 Immediate Anesthesia Transfer of Care Note  Patient: Jodi Goodman  Procedure(s) Performed: COLONOSCOPY WITH PROPOFOL POLYPECTOMY  Patient Location: PACU  Anesthesia Type:General  Level of Consciousness: sedated  Airway & Oxygen Therapy: Patient Spontanous Breathing  Post-op Assessment: Report given to RN and Post -op Vital signs reviewed and stable  Post vital signs: Reviewed and stable  Last Vitals:  Vitals Value Taken Time  BP 100/60 01/02/24 0803  Temp    Pulse 89 01/02/24 0804  Resp 20 01/02/24 0804  SpO2 98 % 01/02/24 0804  Vitals shown include unfiled device data.  Last Pain:  Vitals:   01/02/24 0715  TempSrc: Temporal  PainSc: 9          Complications: No notable events documented.

## 2024-01-03 ENCOUNTER — Encounter: Payer: Self-pay | Admitting: Gastroenterology

## 2024-01-05 LAB — SURGICAL PATHOLOGY

## 2024-02-17 ENCOUNTER — Telehealth: Payer: Self-pay | Admitting: Internal Medicine

## 2024-02-17 NOTE — Telephone Encounter (Signed)
 Enter in error

## 2024-03-30 ENCOUNTER — Other Ambulatory Visit: Payer: Self-pay | Admitting: Internal Medicine

## 2024-03-30 DIAGNOSIS — Z1231 Encounter for screening mammogram for malignant neoplasm of breast: Secondary | ICD-10-CM

## 2024-04-02 ENCOUNTER — Other Ambulatory Visit

## 2024-04-02 ENCOUNTER — Other Ambulatory Visit: Payer: Self-pay | Admitting: Internal Medicine

## 2024-04-03 LAB — HEPATIC FUNCTION PANEL
ALT: 17 IU/L (ref 0–32)
AST: 22 IU/L (ref 0–40)
Albumin: 4.3 g/dL (ref 3.8–4.8)
Alkaline Phosphatase: 145 IU/L — ABNORMAL HIGH (ref 44–121)
Bilirubin Total: 0.2 mg/dL (ref 0.0–1.2)
Bilirubin, Direct: 0.09 mg/dL (ref 0.00–0.40)
Total Protein: 6.5 g/dL (ref 6.0–8.5)

## 2024-04-03 LAB — LIPID PANEL
Chol/HDL Ratio: 3 ratio (ref 0.0–4.4)
Cholesterol, Total: 192 mg/dL (ref 100–199)
HDL: 64 mg/dL (ref >39)
LDL Chol Calc (NIH): 113 mg/dL — ABNORMAL HIGH (ref 0–99)
Triglycerides: 84 mg/dL (ref 0–149)
VLDL Cholesterol Cal: 15 mg/dL (ref 5–40)

## 2024-04-03 LAB — CK: Total CK: 103 U/L (ref 32–182)

## 2024-04-06 ENCOUNTER — Encounter: Payer: Self-pay | Admitting: Internal Medicine

## 2024-04-06 ENCOUNTER — Ambulatory Visit (INDEPENDENT_AMBULATORY_CARE_PROVIDER_SITE_OTHER): Admitting: Internal Medicine

## 2024-04-06 ENCOUNTER — Ambulatory Visit: Payer: Self-pay | Admitting: Internal Medicine

## 2024-04-06 DIAGNOSIS — I10 Essential (primary) hypertension: Secondary | ICD-10-CM | POA: Diagnosis not present

## 2024-04-06 DIAGNOSIS — F411 Generalized anxiety disorder: Secondary | ICD-10-CM | POA: Diagnosis not present

## 2024-04-06 DIAGNOSIS — M5412 Radiculopathy, cervical region: Secondary | ICD-10-CM | POA: Diagnosis not present

## 2024-04-06 DIAGNOSIS — E7800 Pure hypercholesterolemia, unspecified: Secondary | ICD-10-CM | POA: Diagnosis not present

## 2024-04-06 MED ORDER — ALPRAZOLAM 0.25 MG PO TABS: 0.2500 mg | ORAL_TABLET | Freq: Two times a day (BID) | ORAL | 2 refills | Status: DC | PRN

## 2024-04-06 MED ORDER — CHLORTHALIDONE 25 MG PO TABS: 12.5000 mg | ORAL_TABLET | Freq: Every day | ORAL | 1 refills | Status: DC

## 2024-04-06 MED ORDER — EZETIMIBE 10 MG PO TABS: 10.0000 mg | ORAL_TABLET | Freq: Every day | ORAL | Status: DC

## 2024-04-06 NOTE — Progress Notes (Signed)
 Established Patient Office Visit  Subjective:  Patient ID: Jodi Goodman, female    DOB: 11/11/1950  Age: 73 y.o. MRN: [id number]  Chief Complaint  Patient presents with   Follow-up    3 month lab results    No new complaints, here for lab review and medication refills. Labs reviewed and notable for stable but elevated LDL, while alk phos is still elevated while her foot fracture heals.    No other concerns at this time.   Past Medical History:  Diagnosis Date   Anemia    H/O   Anxiety    Arthritis    Bruises easily    Dysrhythmia    PALPITATIONS   GERD (gastroesophageal reflux disease)    Hyperlipidemia    Hypertension     Past Surgical History:  Procedure Laterality Date   CESAREAN SECTION     COLONOSCOPY     COLONOSCOPY WITH PROPOFOL  N/A 01/02/2024   Procedure: COLONOSCOPY WITH PROPOFOL ;  Surgeon: Quintin Buckle, DO;  Location: [facility] ENDOSCOPY;  Service: Gastroenterology;  Laterality: N/A;   EYE SURGERY Bilateral    laser to drain fluid   KNEE ARTHROSCOPY WITH MENISCAL REPAIR Left 09/26/2015   Procedure: KNEE ARTHROSCOPY WITH partial lateral menisectomy and abrasion chondoplasty of patella.;  Surgeon: Elner Hahn, MD;  Location: [facility] ORS;  Service: Orthopedics;  Laterality: Left;   POLYPECTOMY  01/02/2024   Procedure: POLYPECTOMY;  Surgeon: Quintin Buckle, DO;  Location: [facility] ENDOSCOPY;  Service: Gastroenterology;;   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   Procedure: TOTAL KNEE ARTHROPLASTY;  Surgeon: Elner Hahn, MD;  Location: [facility] ORS;  Service: Orthopedics;  Laterality: Left;   WISDOM TOOTH EXTRACTION      Social History   Socioeconomic History   Marital status: Single    Spouse name: Not on file   Number of children: Not on file   Years of education: Not on file   Highest education level: Not on file  Occupational History   Occupation: Janitorial services  Tobacco Use   Smoking status: Never   Smokeless tobacco: Never  Vaping  Use   Vaping status: Never Used  Substance and Sexual Activity   Alcohol  use: No   Drug use: No   Sexual activity: Not Currently  Other Topics Concern   Not on file  Social History Narrative   Lives at home alone   Social Drivers of Health   Financial Resource Strain: Medium Risk (11/10/2023)   Received from [facility]   Overall Financial Resource Strain (CARDIA)    Difficulty of Paying Living Expenses: Somewhat hard  Food Insecurity: Food Insecurity Present (11/10/2023)   Received from [facility]   Hunger Vital Sign    Within the past 12 months, you worried that your food would run out before you got the money to buy more.: Sometimes true    Within the past 12 months, the food you bought just didn't last and you didn't have money to get more.: Sometimes true  Transportation Needs: No Transportation Needs (11/10/2023)   Received from [facility] - Transportation    In the past 12 months, has lack of transportation kept you from medical appointments or from getting medications?: No    Lack of Transportation (Non-Medical): No  Physical Activity: Not on file  Stress: Not on file  Social Connections: Not on file  Intimate Partner Violence: Not on file    Family History  Problem  Relation Age of Onset   Stroke Brother    Heart disease Brother    Breast cancer Neg Hx     Allergies  Allergen Reactions   Latex Hives   Penicillins Hives    Did it involve swelling of the face/tongue/throat, SOB, or low BP? Unknown Did it involve sudden or severe rash/hives, skin peeling, or any reaction on the inside of your mouth or nose? Yes Did you need to seek medical attention at a hospital or doctor'Jodi Goodman office? No When did it last happen? Over 40 years ago      If all above answers are NO, may proceed with cephalosporin use.   Tramadol  Other (See Comments)    Dry mouth   Aspirin Nausea Only    Patient can tolerate coated  aspirin   Sulfa Antibiotics Hives and Other (See Comments)    DRY MOUTH    Outpatient Medications Prior to Visit  Medication Sig   ASPERCREME LIDOCAINE  EX Apply 1 application topically 3 (three) times daily as needed (pain.).   calcium  carbonate (TUMS EX) 750 MG chewable tablet Chew 1 tablet by mouth 3 (three) times daily as needed for heartburn.   gabapentin  (NEURONTIN ) 100 MG capsule Take 1 capsule (100 mg total) by mouth 3 (three) times daily. (Patient taking differently: Take 100 mg by mouth as needed.)   Multiple Vitamins-Minerals (ADULT GUMMY PO) Take 2 tablets by mouth daily with lunch. One-A-Day Women Gummy   ramipril  (ALTACE ) 10 MG capsule Take 1 capsule (10 mg total) by mouth at bedtime.   [DISCONTINUED] ALPRAZolam  (XANAX ) 0.25 MG tablet Take 1 tablet (0.25 mg total) by mouth 2 (two) times daily as needed for anxiety.   [DISCONTINUED] chlorthalidone  (HYGROTON ) 25 MG tablet Take 0.5 tablets (12.5 mg total) by mouth at bedtime.   [DISCONTINUED] ezetimibe  (ZETIA ) 10 MG tablet Take 1 tablet (10 mg total) by mouth daily with lunch.   famotidine  (PEPCID ) 20 MG tablet Take 20 mg by mouth 2 (two) times daily as needed for heartburn or indigestion. (Patient not taking: Reported on 04/06/2024)   No facility-administered medications prior to visit.    Review of Systems  Constitutional:  Negative for weight loss.  HENT: Negative.    Eyes: Negative.   Respiratory: Negative.    Cardiovascular: Negative.   Gastrointestinal: Negative.   Genitourinary: Negative.   Musculoskeletal:  Positive for joint pain (left foot pain).  Skin: Negative.   Neurological: Negative.   Endo/Heme/Allergies: Negative.        Objective:   BP 117/78   Pulse 75   Temp 98.3 F (36.8 C)   Ht 5' 4 (1.626 m)   Wt 149 lb 9.6 oz (67.9 kg)   SpO2 98%   BMI 25.68 kg/m   Vitals:   04/06/24 1425  BP: 117/78  Pulse: 75  Temp: 98.3 F (36.8 C)  Height: 5' 4 (1.626 m)  Weight: 149 lb 9.6 oz (67.9 kg)   SpO2: 98%  BMI (Calculated): 25.67    Physical Exam Vitals reviewed.  Constitutional:      General: She is not in acute distress. HENT:     Head: Normocephalic.     Nose: Nose normal.     Mouth/Throat:     Mouth: Mucous membranes are moist.   Eyes:     Extraocular Movements: Extraocular movements intact.     Pupils: Pupils are equal, round, and reactive to light.    Cardiovascular:     Rate and Rhythm: Normal rate and regular  rhythm.     Heart sounds: No murmur heard. Pulmonary:     Effort: Pulmonary effort is normal.     Breath sounds: No rhonchi or rales.  Abdominal:     General: Abdomen is flat.     Palpations: There is no hepatomegaly, splenomegaly or mass.   Musculoskeletal:        General: Normal range of motion.     Cervical back: Normal range of motion. No tenderness.   Skin:    General: Skin is warm and dry.   Neurological:     General: No focal deficit present.     Mental Status: She is alert and oriented to person, place, and time.     Cranial Nerves: No cranial nerve deficit.     Motor: No weakness.     Comments: No sensory impairment  Psychiatric:        Mood and Affect: Mood normal.        Behavior: Behavior normal.      No results found for any visits on 04/06/24.  Recent Results (from the past 2160 hours)  Lipid panel     Status: Abnormal   Collection Time: 04/02/24  8:38 AM  Result Value Ref Range   Cholesterol, Total 192 100 - 199 mg/dL   Triglycerides 84 0 - 149 mg/dL   HDL 64 >08 mg/dL   VLDL Cholesterol Cal 15 5 - 40 mg/dL   LDL Chol Calc (NIH) 657 (H) 0 - 99 mg/dL   Chol/HDL Ratio 3.0 0.0 - 4.4 ratio    Comment:                                   T. Chol/HDL Ratio                                             Men  Women                               1/2 Avg.Risk  3.4    3.3                                   Avg.Risk  5.0    4.4                                2X Avg.Risk  9.6    7.1                                3X Avg.Risk 23.4    11.0   Hepatic function panel     Status: Abnormal   Collection Time: 04/02/24  8:38 AM  Result Value Ref Range   Total Protein 6.5 6.0 - 8.5 g/dL   Albumin 4.3 3.8 - 4.8 g/dL   Bilirubin Total 0.2 0.0 - 1.2 mg/dL   Bilirubin, Direct 8.46 0.00 - 0.40 mg/dL   Alkaline Phosphatase 145 (H) 44 - 121 IU/L   AST 22 0 - 40 IU/L   ALT 17 0 - 32 IU/L  CK  Status: None   Collection Time: 04/02/24  8:38 AM  Result Value Ref Range   Total CK 103 32 - 182 U/L      Assessment & Plan:  As per problem list. Stricter low calorie diet, low cholesterol and low fat diet and exercise as much as possible.  Problem List Items Addressed This Visit       Cardiovascular and Mediastinum   Primary hypertension   Relevant Medications   chlorthalidone  (HYGROTON ) 25 MG tablet   ezetimibe  (ZETIA ) 10 MG tablet     Other   GAD (generalized anxiety disorder)   Relevant Medications   ALPRAZolam  (XANAX ) 0.25 MG tablet   Pure hypercholesterolemia   Relevant Medications   chlorthalidone  (HYGROTON ) 25 MG tablet   ezetimibe  (ZETIA ) 10 MG tablet   Other Visit Diagnoses       Cervical radiculopathy at C5       Relevant Medications   ALPRAZolam  (XANAX ) 0.25 MG tablet       Return in about 3 months (around 07/07/2024).   Total time spent: 20 minutes  Arzella Bitters, MD  04/06/2024   This document may have been prepared by [facility] Voice Recognition software and as such may include unintentional dictation errors.

## 2024-04-16 ENCOUNTER — Ambulatory Visit
Admission: RE | Admit: 2024-04-16 | Discharge: 2024-04-16 | Disposition: A | Discharge status: Home / Self Care | Source: Ambulatory Visit | Attending: Internal Medicine | Admitting: Internal Medicine

## 2024-04-16 DIAGNOSIS — Z1231 Encounter for screening mammogram for malignant neoplasm of breast: Secondary | ICD-10-CM | POA: Diagnosis present

## 2024-06-29 ENCOUNTER — Other Ambulatory Visit: Payer: Self-pay | Admitting: Internal Medicine

## 2024-06-29 DIAGNOSIS — I10 Essential (primary) hypertension: Secondary | ICD-10-CM

## 2024-07-12 ENCOUNTER — Encounter: Payer: Self-pay | Admitting: Internal Medicine

## 2024-07-12 ENCOUNTER — Ambulatory Visit (INDEPENDENT_AMBULATORY_CARE_PROVIDER_SITE_OTHER): Admitting: Internal Medicine

## 2024-07-12 VITALS — BP 130/82 | HR 60 | Temp 97.9°F | Ht 64.0 in | Wt 153.8 lb

## 2024-07-12 DIAGNOSIS — E7800 Pure hypercholesterolemia, unspecified: Secondary | ICD-10-CM

## 2024-07-12 DIAGNOSIS — F411 Generalized anxiety disorder: Secondary | ICD-10-CM | POA: Diagnosis not present

## 2024-07-12 DIAGNOSIS — I10 Essential (primary) hypertension: Secondary | ICD-10-CM | POA: Diagnosis not present

## 2024-07-12 DIAGNOSIS — G5603 Carpal tunnel syndrome, bilateral upper limbs: Secondary | ICD-10-CM | POA: Diagnosis not present

## 2024-07-12 MED ORDER — NAPROXEN 500 MG PO TABS: 500.0000 mg | ORAL_TABLET | Freq: Two times a day (BID) | ORAL | 2 refills | Status: AC | PRN

## 2024-07-12 MED ORDER — ALPRAZOLAM 0.25 MG PO TABS: 0.2500 mg | ORAL_TABLET | Freq: Two times a day (BID) | ORAL | 2 refills | Status: DC | PRN

## 2024-07-12 NOTE — Progress Notes (Signed)
 Established Patient Office Visit  Subjective:  Patient ID: Jodi Goodman, female    DOB: [birth year]  Age: 73 y.o. MRN: [id number]  Chief Complaint  Patient presents with   Follow-up    3 month follow up    No new complaints, here for lab review and medication refills. C/o worsening numbness of her hands especially at night but doesn't wear her splint every night or take her gabepentin as prescribed. Anxiety is well controlled.     No other concerns at this time.   Past Medical History:  Diagnosis Date   Anemia    H/O   Anxiety    Arthritis    Bruises easily    Dysrhythmia    PALPITATIONS   GERD (gastroesophageal reflux disease)    Hyperlipidemia    Hypertension     Past Surgical History:  Procedure Laterality Date   CESAREAN SECTION     COLONOSCOPY     COLONOSCOPY WITH PROPOFOL  N/A 01/02/2024   Procedure: COLONOSCOPY WITH PROPOFOL ;  Surgeon: Onita Elspeth Sharper, DO;  Location: [facility] ENDOSCOPY;  Service: Gastroenterology;  Laterality: N/A;   EYE SURGERY Bilateral    laser to drain fluid   KNEE ARTHROSCOPY WITH MENISCAL REPAIR Left 09/26/2015   Procedure: KNEE ARTHROSCOPY WITH partial lateral menisectomy and abrasion chondoplasty of patella.;  Surgeon: Norleen JINNY Maltos, MD;  Location: [facility] ORS;  Service: Orthopedics;  Laterality: Left;   POLYPECTOMY  01/02/2024   Procedure: POLYPECTOMY;  Surgeon: Onita Elspeth Sharper, DO;  Location: [facility] ENDOSCOPY;  Service: Gastroenterology;;   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   Procedure: TOTAL KNEE ARTHROPLASTY;  Surgeon: Maltos Norleen JINNY, MD;  Location: [facility] ORS;  Service: Orthopedics;  Laterality: Left;   WISDOM TOOTH EXTRACTION      Social History   Socioeconomic History   Marital status: Single    Spouse name: Not on file   Number of children: Not on file   Years of education: Not on file   Highest education level: Not on file  Occupational History   Occupation: Janitorial services  Tobacco Use   Smoking status:  Never   Smokeless tobacco: Never  Vaping Use   Vaping status: Never Used  Substance and Sexual Activity   Alcohol  use: No   Drug use: No   Sexual activity: Not Currently  Other Topics Concern   Not on file  Social History Narrative   Lives at home alone   Social Drivers of Health   Financial Resource Strain: Medium Risk (11/10/2023)   Received from [facility]   Overall Financial Resource Strain (CARDIA)    Difficulty of Paying Living Expenses: Somewhat hard  Food Insecurity: Food Insecurity Present (11/10/2023)   Received from [facility]   Hunger Vital Sign    Within the past 12 months, you worried that your food would run out before you got the money to buy more.: Sometimes true    Within the past 12 months, the food you bought just didn't last and you didn't have money to get more.: Sometimes true  Transportation Needs: No Transportation Needs (11/10/2023)   Received from [facility] - Transportation    In the past 12 months, has lack of transportation kept you from medical appointments or from getting medications?: No    Lack of Transportation (Non-Medical): No  Physical Activity: Not on file  Stress: Not on file  Social Connections: Not on file  Intimate Partner Violence: Not on file  Family History  Problem Relation Age of Onset   Stroke Brother    Heart disease Brother    Breast cancer Neg Hx     Allergies  Allergen Reactions   Latex Hives   Penicillins Hives    Did it involve swelling of the face/tongue/throat, SOB, or low BP? Unknown Did it involve sudden or severe rash/hives, skin peeling, or any reaction on the inside of your mouth or nose? Yes Did you need to seek medical attention at a hospital or doctor'Aritza Brunet office? No When did it last happen? Over 40 years ago      If all above answers are NO, may proceed with cephalosporin use.   Tramadol  Other (See Comments)    Dry mouth   Aspirin  Nausea Only    Patient can tolerate coated aspirin   Sulfa Antibiotics Hives and Other (See Comments)    DRY MOUTH    Outpatient Medications Prior to Visit  Medication Sig   ASPERCREME LIDOCAINE  EX Apply 1 application topically 3 (three) times daily as needed (pain.).   calcium  carbonate (TUMS EX) 750 MG chewable tablet Chew 1 tablet by mouth 3 (three) times daily as needed for heartburn.   chlorthalidone  (HYGROTON ) 25 MG tablet Take 0.5 tablets (12.5 mg total) by mouth at bedtime.   ezetimibe  (ZETIA ) 10 MG tablet Take 1 tablet (10 mg total) by mouth daily with lunch.   gabapentin  (NEURONTIN ) 100 MG capsule Take 1 capsule (100 mg total) by mouth 3 (three) times daily. (Patient taking differently: Take 100 mg by mouth as needed.)   Multiple Vitamins-Minerals (ADULT GUMMY PO) Take 2 tablets by mouth daily with lunch. One-A-Day Women Gummy   ramipril  (ALTACE ) 10 MG capsule Take 1 capsule by mouth at bedtime   [DISCONTINUED] ALPRAZolam  (XANAX ) 0.25 MG tablet Take 1 tablet (0.25 mg total) by mouth 2 (two) times daily as needed for anxiety.   famotidine  (PEPCID ) 20 MG tablet Take 20 mg by mouth 2 (two) times daily as needed for heartburn or indigestion. (Patient not taking: Reported on 07/12/2024)   No facility-administered medications prior to visit.    Review of Systems  Constitutional:  Negative for weight loss (gained 4 lbs).  HENT: Negative.    Eyes: Negative.   Respiratory: Negative.    Cardiovascular: Negative.   Gastrointestinal: Negative.   Genitourinary: Negative.   Musculoskeletal:  Positive for joint pain (left foot pain).  Skin: Negative.   Neurological: Negative.   Endo/Heme/Allergies: Negative.        Objective:   BP 130/82   Pulse 60   Temp 97.9 F (36.6 C)   Ht 5' 4 (1.626 m)   Wt 153 lb 12.8 oz (69.8 kg)   SpO2 98%   BMI 26.40 kg/m   Vitals:   07/12/24 1302  BP: 130/82  Pulse: 60  Temp: 97.9 F (36.6 C)  Height: 5' 4 (1.626 m)  Weight: 153 lb 12.8  oz (69.8 kg)  SpO2: 98%  BMI (Calculated): 26.39    Physical Exam Vitals reviewed.  Constitutional:      General: She is not in acute distress. HENT:     Head: Normocephalic.     Nose: Nose normal.     Mouth/Throat:     Mouth: Mucous membranes are moist.  Eyes:     Extraocular Movements: Extraocular movements intact.     Pupils: Pupils are equal, round, and reactive to light.  Cardiovascular:     Rate and Rhythm: Normal rate and regular rhythm.  Heart sounds: No murmur heard. Pulmonary:     Effort: Pulmonary effort is normal.     Breath sounds: No rhonchi or rales.  Abdominal:     General: Abdomen is flat.     Palpations: There is no hepatomegaly, splenomegaly or mass.  Musculoskeletal:        General: Normal range of motion.     Cervical back: Normal range of motion. No tenderness.  Skin:    General: Skin is warm and dry.  Neurological:     General: No focal deficit present.     Mental Status: She is alert and oriented to person, place, and time.     Cranial Nerves: No cranial nerve deficit.     Motor: No weakness.     Comments: No sensory impairment  Psychiatric:        Mood and Affect: Mood normal.        Behavior: Behavior normal.      No results found for any visits on 07/12/24.  No results found for this or any previous visit (from the past 2160 hours).    Assessment & Plan:  Meliah was seen today for follow-up.  Bilateral carpal tunnel syndrome  GAD (generalized anxiety disorder) -     ALPRAZolam ; Take 1 tablet (0.25 mg total) by mouth 2 (two) times daily as needed for anxiety.  Dispense: 60 tablet; Refill: 2  Pure hypercholesterolemia -     Comprehensive metabolic panel with GFR -     Lipid panel  Primary hypertension -     CBC With Diff/Platelet  Instructed to comply with wrist brace and to use Gabapentin  at bedtime.  Problem List Items Addressed This Visit       Other   GAD (generalized anxiety disorder)   Relevant Medications    ALPRAZolam  (XANAX ) 0.25 MG tablet   Other Visit Diagnoses       Bilateral carpal tunnel syndrome    -  Primary   Relevant Medications   ALPRAZolam  (XANAX ) 0.25 MG tablet       Return in about 3 months (around 10/11/2024) for fu with labs prior.   Total time spent: 20 minutes  Sherrill Cinderella Perry, MD  07/12/2024   This document may have been prepared by [facility] Voice Recognition software and as such may include unintentional dictation errors.

## 2024-07-14 ENCOUNTER — Telehealth: Payer: Self-pay

## 2024-07-14 NOTE — Telephone Encounter (Signed)
 Pt called complaining that her 500 mg Naproxen  is too strong. Pt states pill is too big and upsets her stomach. Pt states she was previously prescribed something else for her carpal tunnel and is requesting that instead.

## 2024-07-15 ENCOUNTER — Telehealth: Payer: Self-pay

## 2024-07-15 NOTE — Telephone Encounter (Signed)
 Patient called stating that she can not take the naproxen , they are to big for her and they upset her stomach and are to strong. Can you send her in what she was given before for the carpal tunnel please advise

## 2024-07-16 ENCOUNTER — Other Ambulatory Visit: Payer: Self-pay | Admitting: Internal Medicine

## 2024-07-16 DIAGNOSIS — M5412 Radiculopathy, cervical region: Secondary | ICD-10-CM

## 2024-07-16 MED ORDER — GABAPENTIN 100 MG PO CAPS: 100.0000 mg | ORAL_CAPSULE | Freq: Three times a day (TID) | ORAL | 0 refills | Status: DC

## 2024-10-07 ENCOUNTER — Other Ambulatory Visit

## 2024-10-08 LAB — CBC WITH DIFF/PLATELET
Basophils Absolute: 0.1 x10E3/uL (ref 0.0–0.2)
Basos: 1 %
EOS (ABSOLUTE): 0.2 x10E3/uL (ref 0.0–0.4)
Eos: 2 %
Hematocrit: 39.7 % (ref 34.0–46.6)
Hemoglobin: 12.6 g/dL (ref 11.1–15.9)
Immature Grans (Abs): 0 x10E3/uL (ref 0.0–0.1)
Immature Granulocytes: 0 %
Lymphocytes Absolute: 2.1 x10E3/uL (ref 0.7–3.1)
Lymphs: 28 %
MCH: 30.4 pg (ref 26.6–33.0)
MCHC: 31.7 g/dL (ref 31.5–35.7)
MCV: 96 fL (ref 79–97)
Monocytes Absolute: 0.9 x10E3/uL (ref 0.1–0.9)
Monocytes: 12 %
Neutrophils Absolute: 4.2 x10E3/uL (ref 1.4–7.0)
Neutrophils: 57 %
Platelets: 254 x10E3/uL (ref 150–450)
RBC: 4.14 x10E6/uL (ref 3.77–5.28)
RDW: 12.8 % (ref 11.7–15.4)
WBC: 7.4 x10E3/uL (ref 3.4–10.8)

## 2024-10-08 LAB — LIPID PANEL
Chol/HDL Ratio: 3.3 ratio (ref 0.0–4.4)
Cholesterol, Total: 179 mg/dL (ref 100–199)
HDL: 55 mg/dL
LDL Chol Calc (NIH): 107 mg/dL — ABNORMAL HIGH (ref 0–99)
Triglycerides: 96 mg/dL (ref 0–149)
VLDL Cholesterol Cal: 17 mg/dL (ref 5–40)

## 2024-10-08 LAB — COMPREHENSIVE METABOLIC PANEL WITH GFR
ALT: 16 IU/L (ref 0–32)
AST: 22 IU/L (ref 0–40)
Albumin: 4.2 g/dL (ref 3.8–4.8)
Alkaline Phosphatase: 125 IU/L (ref 49–135)
BUN/Creatinine Ratio: 25 (ref 12–28)
BUN: 21 mg/dL (ref 8–27)
Bilirubin Total: 0.3 mg/dL (ref 0.0–1.2)
CO2: 25 mmol/L (ref 20–29)
Calcium: 10.4 mg/dL — ABNORMAL HIGH (ref 8.7–10.3)
Chloride: 104 mmol/L (ref 96–106)
Creatinine, Ser: 0.84 mg/dL (ref 0.57–1.00)
Globulin, Total: 2.2 g/dL (ref 1.5–4.5)
Glucose: 90 mg/dL (ref 70–99)
Potassium: 4 mmol/L (ref 3.5–5.2)
Sodium: 142 mmol/L (ref 134–144)
Total Protein: 6.4 g/dL (ref 6.0–8.5)
eGFR: 73 mL/min/1.73

## 2024-10-11 ENCOUNTER — Ambulatory Visit: Admitting: Internal Medicine

## 2024-11-01 ENCOUNTER — Emergency Department
Admission: EM | Admit: 2024-11-01 | Discharge: 2024-11-01 | Disposition: A | Discharge status: Home / Self Care | Attending: Emergency Medicine | Admitting: Emergency Medicine

## 2024-11-01 ENCOUNTER — Emergency Department

## 2024-11-01 ENCOUNTER — Other Ambulatory Visit: Payer: Self-pay

## 2024-11-01 DIAGNOSIS — E876 Hypokalemia: Secondary | ICD-10-CM | POA: Diagnosis not present

## 2024-11-01 DIAGNOSIS — R42 Dizziness and giddiness: Secondary | ICD-10-CM | POA: Insufficient documentation

## 2024-11-01 LAB — COMPREHENSIVE METABOLIC PANEL WITH GFR
ALT: 23 U/L (ref 0–44)
AST: 25 U/L (ref 15–41)
Albumin: 4.4 g/dL (ref 3.5–5.0)
Alkaline Phosphatase: 126 U/L (ref 38–126)
Anion gap: 12 (ref 5–15)
BUN: 19 mg/dL (ref 8–23)
CO2: 27 mmol/L (ref 22–32)
Calcium: 10.2 mg/dL (ref 8.9–10.3)
Chloride: 103 mmol/L (ref 98–111)
Creatinine, Ser: 0.67 mg/dL (ref 0.44–1.00)
GFR, Estimated: >60 mL/min
Glucose, Bld: 100 mg/dL — ABNORMAL HIGH (ref 70–99)
Potassium: 3.3 mmol/L — ABNORMAL LOW (ref 3.5–5.1)
Sodium: 142 mmol/L (ref 135–145)
Total Bilirubin: 0.2 mg/dL (ref 0.0–1.2)
Total Protein: 7.2 g/dL (ref 6.5–8.1)

## 2024-11-01 LAB — URINALYSIS, ROUTINE W REFLEX MICROSCOPIC
Bilirubin Urine: NEGATIVE
Glucose, UA: NEGATIVE mg/dL
Hgb urine dipstick: NEGATIVE
Ketones, ur: NEGATIVE mg/dL
Leukocytes,Ua: NEGATIVE
Nitrite: NEGATIVE
Protein, ur: NEGATIVE mg/dL
Specific Gravity, Urine: 1.016 (ref 1.005–1.030)
pH: 7 (ref 5.0–8.0)

## 2024-11-01 LAB — CBC
HCT: 39.2 % (ref 36.0–46.0)
Hemoglobin: 12.9 g/dL (ref 12.0–15.0)
MCH: 30.6 pg (ref 26.0–34.0)
MCHC: 32.9 g/dL (ref 30.0–36.0)
MCV: 92.9 fL (ref 80.0–100.0)
Platelets: 259 K/uL (ref 150–400)
RBC: 4.22 MIL/uL (ref 3.87–5.11)
RDW: 12.6 % (ref 11.5–15.5)
WBC: 8.4 K/uL (ref 4.0–10.5)
nRBC: 0 % (ref 0.0–0.2)

## 2024-11-01 LAB — RESP PANEL BY RT-PCR (RSV, FLU A&B, COVID)  RVPGX2
Influenza A by PCR: NEGATIVE
Influenza B by PCR: NEGATIVE
Resp Syncytial Virus by PCR: NEGATIVE
SARS Coronavirus 2 by RT PCR: NEGATIVE

## 2024-11-01 LAB — CBG MONITORING, ED: Glucose-Capillary: 91 mg/dL (ref 70–99)

## 2024-11-01 MED ORDER — MECLIZINE HCL 12.5 MG PO TABS: 12.5000 mg | ORAL_TABLET | Freq: Three times a day (TID) | ORAL | 0 refills | Status: AC | PRN

## 2024-11-01 NOTE — ED Triage Notes (Signed)
 Pt arrives via ems c/o sudden onset of dizziness/nausea two hours pta.pt reports she was lying down and when she sat up the dizziness began. Pt received 4 mg of zofran  and 250 mL of LR en route to hospital. Pt has hx  vertigo; recent exposure to flu. Pt reports feeling lightheaded and nauseated, generalized weakness. No slurred speech, unilateral weakness, for loss os sensation noted. CBG in triage is 40

## 2024-11-01 NOTE — Discharge Instructions (Addendum)
We believe your symptoms were caused by benign vertigo.  Please read through the included information and take any prescribed medication(s).  Follow up with your doctor as listed above.  If you develop any new or worsening symptoms that concern you, including but not limited to persistent dizziness/vertigo, numbness or weakness in your arms or legs, altered mental status, persistent vomiting, or fever greater than 101, please return immediately to the Emergency Department.  

## 2024-11-01 NOTE — ED Provider Notes (Signed)
 "  [facility] Provider Note    Event Date/Time   First MD Initiated Contact with Patient 11/01/24 0145     (approximate)   History   Dizziness   HPI Jodi Goodman is a 74 y.o. female who presents for evaluation of acute onset dizziness or vertigo.  She was with her daughter (who is also at bedside) and she states that all of the sudden after having a normal day she started to feel like the room was spinning.  She felt little bit nauseated.  She had no focal numbness or weakness in her extremities and no chest pain or shortness of breath.  Her daughter is concerned that she seemed generally weak after the episode and that she also seemed confused, although the patient says that this was just a misunderstanding.  She has no prior history of CVA but she does have a history of prior episodes of vertigo.  She said that her ears have been feeling fine recently and she has not had any viral symptoms although she was exposed to someone with influenza.     Physical Exam   Triage Vital Signs: ED Triage Vitals  Encounter Vitals Group     BP 11/01/24 0052 139/77     Girls Systolic BP Percentile --      Girls Diastolic BP Percentile --      Boys Systolic BP Percentile --      Boys Diastolic BP Percentile --      Pulse Rate 11/01/24 0052 60     Resp 11/01/24 0052 18     Temp 11/01/24 0052 98 F (36.7 C)     Temp Source 11/01/24 0052 Oral     SpO2 11/01/24 0052 97 %     Weight 11/01/24 0053 63.5 kg (140 lb)     Height 11/01/24 0053 1.626 m (5' 4)     Head Circumference --      Peak Flow --      Pain Score 11/01/24 0053 0     Pain Loc --      Pain Education --      Exclude from Growth Chart --     Most recent vital signs: Vitals:   11/01/24 0052  BP: 139/77  Pulse: 60  Resp: 18  Temp: 98 F (36.7 C)  SpO2: 97%    General: Awake, no distress.  Well-appearing, pleasant and conversant. HEENT: Pupils are equal and reactive, no nystagmus, normal  extraocular motion and extremes of gaze, ears are clean and clear bilaterally with normal-appearing tympanic membranes. CV:  Good peripheral perfusion.  Resp:  Normal effort. Speaking easily and comfortably, no accessory muscle usage nor intercostal retractions.   Abd:  No distention.  Other:  Patient's neurological exam is reassuring with no focal neurological deficits, no dysarthria nor aphasia, no facial droop   ED Results / Procedures / Treatments   Labs (all labs ordered are listed, but only abnormal results are displayed) Labs Reviewed  COMPREHENSIVE METABOLIC PANEL WITH GFR - Abnormal; Notable for the following components:      Result Value   Potassium 3.3 (*)    Glucose, Bld 100 (*)    All other components within normal limits  URINALYSIS, ROUTINE W REFLEX MICROSCOPIC - Abnormal; Notable for the following components:   Color, Urine YELLOW (*)    APPearance CLEAR (*)    All other components within normal limits  RESP PANEL BY RT-PCR (RSV, FLU A&B, COVID)  [id number]  CBC  CBG MONITORING, ED     EKG  ED ECG REPORT I, Darleene Dome, the attending physician, personally viewed and interpreted this ECG.  Date: 11/01/2024 EKG Time: 00: 55 Rate: 67 Rhythm: normal sinus rhythm QRS Axis: normal Intervals: normal ST/T Wave abnormalities: T wave inversions in lead III, otherwise normal Narrative Interpretation: no evidence of acute ischemia    RADIOLOGY See ED course for details   PROCEDURES:  Critical Care performed: No  Procedures    IMPRESSION / MDM / ASSESSMENT AND PLAN / ED COURSE  I reviewed the triage vital signs and the nursing notes.                              Differential diagnosis includes, but is not limited to, peripheral vertigo, central vertigo (CVA), electrolyte or metabolic abnormality, acute viral infection.  Patient's presentation is most consistent with acute presentation with potential threat to life or bodily function.  Labs/studies  ordered (see ED course for additional labs and studies that may have been added later): Respiratory viral panel, urinalysis, CMP, CBC, MR brain  Interventions/Medications given:  Medications - No data to display  (Note:  hospital course my include additional interventions and/or labs/studies not listed above.)   Vitals are stable, normal physical exam, labs are all within normal limits with no sign of infection or other acute abnormality.  Patient is feeling much better than before.  Her daughter is still more concerned than is the patient, and we discussed MRI and eventually decided to proceed to rule out CVA.  Anticipate discharge with outpatient follow-up if MRI is negative.   Clinical Course as of 11/01/24 0448  Mon Nov 01, 2024  9553 MR BRAIN WO CONTRAST I independently viewed and interpreted the patient's brain MRI and I see no evidence that would suggest areas of ischemia.  I also read the radiologist's report, which confirmed no acute findings. [CF]  [phone] Patient remains asymptomatic and is comfortable with the plan for discharge and outpatient follow-up.   The patient's medical screening exam is reassuring with no indication of an emergent medical condition requiring hospitalization or additional evaluation at this point.  The patient is safe and appropriate for discharge and outpatient follow up. [CF]    Clinical Course User Index [CF] Dome Darleene, MD     FINAL CLINICAL IMPRESSION(S) / ED DIAGNOSES   Final diagnoses:  Vertigo     Rx / DC Orders   ED Discharge Orders          Ordered    meclizine  (ANTIVERT ) 12.5 MG tablet  3 times daily PRN        11/01/24 0448             Note:  This document was prepared using Dragon voice recognition software and may include unintentional dictation errors.   Dome Darleene, MD 11/01/24 0448  "

## 2024-11-08 ENCOUNTER — Ambulatory Visit: Admitting: Internal Medicine

## 2024-11-08 ENCOUNTER — Ambulatory Visit: Payer: Self-pay | Admitting: Internal Medicine

## 2024-11-08 ENCOUNTER — Encounter: Payer: Self-pay | Admitting: Internal Medicine

## 2024-11-08 DIAGNOSIS — E7800 Pure hypercholesterolemia, unspecified: Secondary | ICD-10-CM | POA: Diagnosis not present

## 2024-11-08 DIAGNOSIS — Z739 Problem related to life management difficulty, unspecified: Secondary | ICD-10-CM

## 2024-11-08 DIAGNOSIS — I10 Essential (primary) hypertension: Secondary | ICD-10-CM | POA: Diagnosis not present

## 2024-11-08 DIAGNOSIS — F411 Generalized anxiety disorder: Secondary | ICD-10-CM

## 2024-11-08 DIAGNOSIS — Z0001 Encounter for general adult medical examination with abnormal findings: Secondary | ICD-10-CM | POA: Diagnosis not present

## 2024-11-08 DIAGNOSIS — Z1331 Encounter for screening for depression: Secondary | ICD-10-CM | POA: Diagnosis not present

## 2024-11-08 MED ORDER — ALPRAZOLAM 0.25 MG PO TABS: 0.2500 mg | ORAL_TABLET | Freq: Two times a day (BID) | ORAL | 2 refills | Status: AC | PRN

## 2024-11-08 MED ORDER — EZETIMIBE 10 MG PO TABS: 10.0000 mg | ORAL_TABLET | Freq: Every day | ORAL | Status: AC

## 2024-11-08 MED ORDER — CHLORTHALIDONE 25 MG PO TABS: 12.5000 mg | ORAL_TABLET | Freq: Every day | ORAL | 1 refills | Status: AC

## 2024-11-08 NOTE — Progress Notes (Signed)
 "  Established Patient Office Visit  Subjective:  Patient ID: Jodi Goodman, female    DOB: [birth year]  Age: 74 y.o. MRN: [id number]  Chief Complaint  Patient presents with   Annual Exam    3 month AWV    No new complaints, here for AWV refer to quality metrics and scanned documents.  Dizziness for which she went to the ER has resolved. Labs reviewed and notable for elevated LDL and calcium .      No other concerns at this time.   Past Medical History:  Diagnosis Date   Anemia    H/O   Anxiety    Arthritis    Bruises easily    Dysrhythmia    PALPITATIONS   GERD (gastroesophageal reflux disease)    Hyperlipidemia    Hypertension     Past Surgical History:  Procedure Laterality Date   CESAREAN SECTION     COLONOSCOPY     COLONOSCOPY WITH PROPOFOL  N/A 01/02/2024   Procedure: COLONOSCOPY WITH PROPOFOL ;  Surgeon: Onita Elspeth Sharper, DO;  Location: [facility] ENDOSCOPY;  Service: Gastroenterology;  Laterality: N/A;   EYE SURGERY Bilateral    laser to drain fluid   KNEE ARTHROSCOPY WITH MENISCAL REPAIR Left 09/26/2015   Procedure: KNEE ARTHROSCOPY WITH partial lateral menisectomy and abrasion chondoplasty of patella.;  Surgeon: Norleen JINNY Maltos, MD;  Location: [facility] ORS;  Service: Orthopedics;  Laterality: Left;   POLYPECTOMY  01/02/2024   Procedure: POLYPECTOMY;  Surgeon: Onita Elspeth Sharper, DO;  Location: [facility] ENDOSCOPY;  Service: Gastroenterology;;   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   Procedure: TOTAL KNEE ARTHROPLASTY;  Surgeon: Maltos Norleen JINNY, MD;  Location: [facility] ORS;  Service: Orthopedics;  Laterality: Left;   WISDOM TOOTH EXTRACTION      Social History   Socioeconomic History   Marital status: Single    Spouse name: Not on file   Number of children: Not on file   Years of education: Not on file   Highest education level: Not on file  Occupational History   Occupation: Janitorial services  Tobacco Use   Smoking status: Never   Smokeless tobacco: Never   Vaping Use   Vaping status: Never Used  Substance and Sexual Activity   Alcohol  use: No   Drug use: No   Sexual activity: Not Currently  Other Topics Concern   Not on file  Social History Narrative   Lives at home alone   Social Drivers of Health   Tobacco Use: Low Risk  (10/01/2024)   Received [facility]   Patient History    Smoking Tobacco Use: Never    Smokeless Tobacco Use: Never    Passive Exposure: Not on file  Financial Resource Strain: Medium Risk (11/10/2023)   Received from [facility]   Overall Financial Resource Strain (CARDIA)    Difficulty of Paying Living Expenses: Somewhat hard  Food Insecurity: Food Insecurity Present (11/10/2023)   [facility]   Epic    Within the past 12 months, you worried that your food would run out before you got the money to buy more.: Sometimes true    Within the past 12 months, the food you bought just didn't last and you didn't have money to get more.: Sometimes true  Transportation Needs: No Transportation Needs (11/10/2023)   Received from [facility] - Transportation    In the past 12 months, has lack of transportation kept you from medical  appointments or from getting medications?: No    Lack of Transportation (Non-Medical): No  Physical Activity: Not on file  Stress: Not on file  Social Connections: Not on file  Intimate Partner Violence: Not on file  Depression (PHQ2-9): Low Risk (06/30/2023)   Depression (PHQ2-9)    PHQ-2 Score: 1  Alcohol  Screen: Not on file  Housing: Low Risk  (11/10/2023)   Received from [facility]   Epic    In the last 12 months, was there a time when you were not able to pay the mortgage or rent on time?: No    In the past 12 months, how many times have you moved where you were living?: 0    At any time in the past 12 months, were you homeless or living in a shelter (including now)?:  No  Utilities: Not At Risk (11/10/2023)   Received from [facility] Utilities    Threatened with loss of utilities: No  Health Literacy: Not on file    Family History  Problem Relation Age of Onset   Stroke Brother    Heart disease Brother    Breast cancer Neg Hx     Allergies[1]  Show/hide medication list[2]  Review of Systems  Constitutional:  Negative for weight loss (gained 14 lbs).  HENT: Negative.    Eyes: Negative.   Respiratory: Negative.    Cardiovascular: Negative.   Gastrointestinal: Negative.   Genitourinary: Negative.   Musculoskeletal:  Positive for joint pain (left foot pain).  Skin: Negative.   Neurological: Negative.   Endo/Heme/Allergies: Negative.        Objective:   BP 112/78   Pulse 71   Temp 98.6 F (37 C)   Ht 5' 4 (1.626 m)   Wt 154 lb 6.4 oz (70 kg)   SpO2 95%   BMI 26.50 kg/m   Vitals:   11/08/24 1257  BP: 112/78  Pulse: 71  Temp: 98.6 F (37 C)  Height: 5' 4 (1.626 m)  Weight: 154 lb 6.4 oz (70 kg)  SpO2: 95%  BMI (Calculated): 26.49    Physical Exam Vitals reviewed.  Constitutional:      General: She is not in acute distress. HENT:     Head: Normocephalic.     Nose: Nose normal.     Mouth/Throat:     Mouth: Mucous membranes are moist.  Eyes:     Extraocular Movements: Extraocular movements intact.     Pupils: Pupils are equal, round, and reactive to light.  Cardiovascular:     Rate and Rhythm: Normal rate and regular rhythm.     Heart sounds: No murmur heard. Pulmonary:     Effort: Pulmonary effort is normal.     Breath sounds: No rhonchi or rales.  Abdominal:     General: Abdomen is flat.     Palpations: There is no hepatomegaly, splenomegaly or mass.  Musculoskeletal:        General: Normal range of motion.     Cervical back: Normal range of motion. No tenderness.  Skin:    General: Skin is warm and dry.  Neurological:     General: No focal deficit present.     Mental Status:  She is alert and oriented to person, place, and time.     Cranial Nerves: No cranial nerve deficit.     Motor: No weakness.     Comments: No sensory impairment  Psychiatric:  Mood and Affect: Mood normal.        Behavior: Behavior normal.      No results found for any visits on 11/08/24.  Recent Results (from the past 2160 hours)  Comprehensive metabolic panel with GFR     Status: Abnormal   Collection Time: 10/07/24  8:32 AM  Result Value Ref Range   Glucose 90 70 - 99 mg/dL   BUN 21 8 - 27 mg/dL   Creatinine, Ser 9.15 0.57 - 1.00 mg/dL   eGFR 73 >40 fO/fpw/8.26   BUN/Creatinine Ratio 25 12 - 28   Sodium 142 134 - 144 mmol/L   Potassium 4.0 3.5 - 5.2 mmol/L   Chloride 104 96 - 106 mmol/L   CO2 25 20 - 29 mmol/L   Calcium  10.4 (H) 8.7 - 10.3 mg/dL   Total Protein 6.4 6.0 - 8.5 g/dL   Albumin 4.2 3.8 - 4.8 g/dL   Globulin, Total 2.2 1.5 - 4.5 g/dL   Bilirubin Total 0.3 0.0 - 1.2 mg/dL   Alkaline Phosphatase 125 49 - 135 IU/L   AST 22 0 - 40 IU/L   ALT 16 0 - 32 IU/L  Lipid panel     Status: Abnormal   Collection Time: 10/07/24  8:32 AM  Result Value Ref Range   Cholesterol, Total 179 100 - 199 mg/dL   Triglycerides 96 0 - 149 mg/dL   HDL 55 >60 mg/dL   VLDL Cholesterol Cal 17 5 - 40 mg/dL   LDL Chol Calc (NIH) 892 (H) 0 - 99 mg/dL   Chol/HDL Ratio 3.3 0.0 - 4.4 ratio    Comment:                                   T. Chol/HDL Ratio                                             Men  Women                               1/2 Avg.Risk  3.4    3.3                                   Avg.Risk  5.0    4.4                                2X Avg.Risk  9.6    7.1                                3X Avg.Risk 23.4   11.0   CBC With Diff/Platelet     Status: None   Collection Time: 10/07/24  8:32 AM  Result Value Ref Range   WBC 7.4 3.4 - 10.8 x10E3/uL   RBC 4.14 3.77 - 5.28 x10E6/uL   Hemoglobin 12.6 11.1 - 15.9 g/dL   Hematocrit 60.2 65.9 - 46.6 %   MCV 96 79 - 97 fL   MCH  30.4 26.6 - 33.0 pg   MCHC 31.7 31.5 - 35.7  g/dL   RDW 87.1 88.2 - 84.5 %   Platelets 254 150 - 450 x10E3/uL   Neutrophils 57 Not Estab. %   Lymphs 28 Not Estab. %   Monocytes 12 Not Estab. %   Eos 2 Not Estab. %   Basos 1 Not Estab. %   Neutrophils Absolute 4.2 1.4 - 7.0 x10E3/uL   Lymphocytes Absolute 2.1 0.7 - 3.1 x10E3/uL   Monocytes Absolute 0.9 0.1 - 0.9 x10E3/uL   EOS (ABSOLUTE) 0.2 0.0 - 0.4 x10E3/uL   Basophils Absolute 0.1 0.0 - 0.2 x10E3/uL   Immature Granulocytes 0 Not Estab. %   Immature Grans (Abs) 0.0 0.0 - 0.1 x10E3/uL  CBG monitoring, ED     Status: None   Collection Time: 11/01/24 12:55 AM  Result Value Ref Range   Glucose-Capillary 91 70 - 99 mg/dL    Comment: Glucose reference range applies only to samples taken after fasting for at least 8 hours.  Comprehensive metabolic panel     Status: Abnormal   Collection Time: 11/01/24 12:59 AM  Result Value Ref Range   Sodium 142 135 - 145 mmol/L   Potassium 3.3 (L) 3.5 - 5.1 mmol/L   Chloride 103 98 - 111 mmol/L   CO2 27 22 - 32 mmol/L   Glucose, Bld 100 (H) 70 - 99 mg/dL    Comment: Glucose reference range applies only to samples taken after fasting for at least 8 hours.   BUN 19 8 - 23 mg/dL   Creatinine, Ser 9.32 0.44 - 1.00 mg/dL   Calcium  10.2 8.9 - 10.3 mg/dL   Total Protein 7.2 6.5 - 8.1 g/dL   Albumin 4.4 3.5 - 5.0 g/dL   AST 25 15 - 41 U/L   ALT 23 0 - 44 U/L   Alkaline Phosphatase 126 38 - 126 U/L   Total Bilirubin 0.2 0.0 - 1.2 mg/dL   GFR, Estimated >39 >39 mL/min    Comment: (NOTE) Calculated using the CKD-EPI Creatinine Equation (2021)    Anion gap 12 5 - 15    Comment: Performed at [facility], [address] [place], [place] [address]  CBC     Status: None   Collection Time: 11/01/24 12:59 AM  Result Value Ref Range   WBC 8.4 4.0 - 10.5 K/uL   RBC 4.22 3.87 - 5.11 MIL/uL   Hemoglobin 12.9 12.0 - 15.0 g/dL   HCT 60.7 63.9 - 53.9 %   MCV 92.9 80.0 - 100.0 fL   MCH 30.6 26.0 -  34.0 pg   MCHC 32.9 30.0 - 36.0 g/dL   RDW 87.3 88.4 - 84.4 %   Platelets 259 150 - 400 K/uL   nRBC 0.0 0.0 - 0.2 %    Comment: Performed at [facility], [address] [place], [place] [address]  Urinalysis, Routine w reflex microscopic -Urine, Clean Catch     Status: Abnormal   Collection Time: 11/01/24  2:20 AM  Result Value Ref Range   Color, Urine YELLOW (A) YELLOW   APPearance CLEAR (A) CLEAR   Specific Gravity, Urine 1.016 1.005 - 1.030   pH 7.0 5.0 - 8.0   Glucose, UA NEGATIVE NEGATIVE mg/dL   Hgb urine dipstick NEGATIVE NEGATIVE   Bilirubin Urine NEGATIVE NEGATIVE   Ketones, ur NEGATIVE NEGATIVE mg/dL   Protein, ur NEGATIVE NEGATIVE mg/dL   Nitrite NEGATIVE NEGATIVE   Leukocytes,Ua NEGATIVE NEGATIVE    Comment: Performed at [facility], [address] [place],  [place] [address]  Resp panel by RT-PCR (RSV, Flu A&B, Covid) Anterior Nasal Swab     Status: None   Collection Time: 11/01/24  2:27 AM   Specimen: Anterior Nasal Swab  Result Value Ref Range   SARS Coronavirus 2 by RT PCR NEGATIVE NEGATIVE    Comment: (NOTE) SARS-CoV-2 target nucleic acids are NOT DETECTED.  The SARS-CoV-2 RNA is generally detectable in upper respiratory specimens during the acute phase of infection. The lowest concentration of SARS-CoV-2 viral copies this assay can detect is 138 copies/mL. A negative result does not preclude SARS-Cov-2 infection and should not be used as the sole basis for treatment or other patient management decisions. A negative result may occur with  improper specimen collection/handling, submission of specimen other than nasopharyngeal swab, presence of viral mutation(Jarod Bozzo) within the areas targeted by this assay, and inadequate number of viral copies(<138 copies/mL). A negative result must be combined with clinical observations, patient history, and epidemiological information. The expected result is Negative.  Fact Sheet for Patients:   bloggercourse.com  Fact Sheet for Healthcare Providers:  seriousbroker.it  This test is no t yet approved or cleared by the [place]  FDA and  has been authorized for detection and/or diagnosis of SARS-CoV-2 by FDA under an Emergency Use Authorization (EUA). This EUA will remain  in effect (meaning this test can be used) for the duration of the COVID-19 declaration under Section 564(b)(1) of the Act, 21 U.Karelly Dewalt.C.section 360bbb-3(b)(1), unless the authorization is terminated  or revoked sooner.       Influenza A by PCR NEGATIVE NEGATIVE   Influenza B by PCR NEGATIVE NEGATIVE    Comment: (NOTE) The Xpert Xpress SARS-CoV-2/FLU/RSV plus assay is intended as an aid in the diagnosis of influenza from Nasopharyngeal swab specimens and should not be used as a sole basis for treatment. Nasal washings and aspirates are unacceptable for Xpert Xpress SARS-CoV-2/FLU/RSV testing.  Fact Sheet for Patients: bloggercourse.com  Fact Sheet for Healthcare Providers: seriousbroker.it  This test is not yet approved or cleared by the [place]  FDA and has been authorized for detection and/or diagnosis of SARS-CoV-2 by FDA under an Emergency Use Authorization (EUA). This EUA will remain in effect (meaning this test can be used) for the duration of the COVID-19 declaration under Section 564(b)(1) of the Act, 21 U.Akyia Borelli.C. section 360bbb-3(b)(1), unless the authorization is terminated or revoked.     Resp Syncytial Virus by PCR NEGATIVE NEGATIVE    Comment: (NOTE) Fact Sheet for Patients: bloggercourse.com  Fact Sheet for Healthcare Providers: seriousbroker.it  This test is not yet approved or cleared by the [place]  FDA and has been authorized for detection and/or diagnosis of SARS-CoV-2 by FDA under an Emergency Use Authorization (EUA).  This EUA will remain in effect (meaning this test can be used) for the duration of the COVID-19 declaration under Section 564(b)(1) of the Act, 21 U.Isabela Nardelli.C. section 360bbb-3(b)(1), unless the authorization is terminated or revoked.  Performed at [facility], [address] [place], [place] [address]       Assessment & Plan:  Vergene was seen today for annual exam.  GAD (generalized anxiety disorder) -     ALPRAZolam ; Take 1 tablet (0.25 mg total) by mouth 2 (two) times daily as needed for anxiety.  Dispense: 60 tablet; Refill: 2  Primary hypertension -     Chlorthalidone ; Take 0.5 tablets (12.5 mg total) by mouth at bedtime.  Dispense: 90 tablet; Refill: 1  Pure hypercholesterolemia -     Ezetimibe ;  Take 1 tablet (10 mg total) by mouth daily with lunch.  Dispense: 90 tablet; Refill: 01    Problem List Items Addressed This Visit       Cardiovascular and Mediastinum   Primary hypertension   Relevant Medications   chlorthalidone  (HYGROTON ) 25 MG tablet   ezetimibe  (ZETIA ) 10 MG tablet     Other   GAD (generalized anxiety disorder)   Relevant Medications   ALPRAZolam  (XANAX ) 0.25 MG tablet   Pure hypercholesterolemia   Relevant Medications   chlorthalidone  (HYGROTON ) 25 MG tablet   ezetimibe  (ZETIA ) 10 MG tablet    Return in 3 months (on 02/06/2025) for med refills.   Total time spent: 20 minutes. This time includes review of previous notes and results and patient face to face interaction during today'Swan Zayed visit.    Sherrill Cinderella Perry, MD  11/08/2024   This document may have been prepared by [facility] Voice Recognition software and as such may include unintentional dictation errors.     [1]  Allergies Allergen Reactions   Latex Hives   Penicillins Hives    Did it involve swelling of the face/tongue/throat, SOB, or low BP? Unknown Did it involve sudden or severe rash/hives, skin peeling, or any reaction on the inside of your mouth or nose? Yes Did you  need to seek medical attention at a hospital or doctor'Paden Senger office? No When did it last happen? Over 40 years ago      If all above answers are NO, may proceed with cephalosporin use.   Tramadol  Other (See Comments)    Dry mouth   Aspirin Nausea Only    Patient can tolerate coated aspirin   Sulfa Antibiotics Hives and Other (See Comments)    DRY MOUTH  [2]  Outpatient Medications Prior to Visit  Medication Sig Note   ASPERCREME LIDOCAINE  EX Apply 1 application topically 3 (three) times daily as needed (pain.).    calcium  carbonate (TUMS EX) 750 MG chewable tablet Chew 1 tablet by mouth 3 (three) times daily as needed for heartburn.    meclizine  (ANTIVERT ) 12.5 MG tablet Take 1 tablet (12.5 mg total) by mouth 3 (three) times daily as needed for dizziness.    Multiple Vitamins-Minerals (ADULT GUMMY PO) Take 2 tablets by mouth daily with lunch. One-A-Day Women Gummy    ramipril  (ALTACE ) 10 MG capsule Take 1 capsule by mouth at bedtime    [DISCONTINUED] ALPRAZolam  (XANAX ) 0.25 MG tablet Take 1 tablet (0.25 mg total) by mouth 2 (two) times daily as needed for anxiety.    [DISCONTINUED] chlorthalidone  (HYGROTON ) 25 MG tablet Take 0.5 tablets (12.5 mg total) by mouth at bedtime.    [DISCONTINUED] ezetimibe  (ZETIA ) 10 MG tablet Take 1 tablet (10 mg total) by mouth daily with lunch.    [DISCONTINUED] gabapentin  (NEURONTIN ) 100 MG capsule Take 1 capsule (100 mg total) by mouth 3 (three) times daily. 11/08/2024: fear of weight gain   famotidine  (PEPCID ) 20 MG tablet Take 20 mg by mouth 2 (two) times daily as needed for heartburn or indigestion. (Patient not taking: Reported on 11/08/2024)    No facility-administered medications prior to visit.   "

## 2025-02-08 ENCOUNTER — Ambulatory Visit: Admitting: Internal Medicine
